# Patient Record
Sex: Female | Born: 1975 | Race: White | Hispanic: No | Marital: Married | Smoking: Former smoker
Health system: Southern US, Community
[De-identification: ages and names within clinical notes are randomized; demographics above are authoritative.]

## PROBLEM LIST (undated history)

## (undated) DIAGNOSIS — F411 Generalized anxiety disorder: Secondary | ICD-10-CM

## (undated) DIAGNOSIS — I10 Essential (primary) hypertension: Secondary | ICD-10-CM

## (undated) DIAGNOSIS — K219 Gastro-esophageal reflux disease without esophagitis: Secondary | ICD-10-CM

## (undated) DIAGNOSIS — C539 Malignant neoplasm of cervix uteri, unspecified: Secondary | ICD-10-CM

## (undated) HISTORY — PX: ROTATOR CUFF REPAIR: SHX139

## (undated) HISTORY — PX: BREAST BIOPSY: SHX20

## (undated) HISTORY — DX: Generalized anxiety disorder: F41.1

## (undated) HISTORY — DX: Essential (primary) hypertension: I10

## (undated) HISTORY — DX: Malignant neoplasm of cervix uteri, unspecified: C53.9

## (undated) HISTORY — DX: Gastro-esophageal reflux disease without esophagitis: K21.9

---

## 1997-07-10 HISTORY — PX: BACK SURGERY: SHX140

## 2006-07-10 DIAGNOSIS — C539 Malignant neoplasm of cervix uteri, unspecified: Secondary | ICD-10-CM

## 2006-07-10 HISTORY — PX: PARTIAL HYSTERECTOMY: SHX80

## 2006-07-10 HISTORY — DX: Malignant neoplasm of cervix uteri, unspecified: C53.9

## 2006-10-31 ENCOUNTER — Encounter (INDEPENDENT_AMBULATORY_CARE_PROVIDER_SITE_OTHER): Payer: Self-pay | Admitting: *Deleted

## 2006-10-31 ENCOUNTER — Ambulatory Visit: Payer: Self-pay | Admitting: Internal Medicine

## 2006-10-31 ENCOUNTER — Encounter: Payer: Self-pay | Admitting: Internal Medicine

## 2006-10-31 ENCOUNTER — Other Ambulatory Visit: Admission: RE | Admit: 2006-10-31 | Discharge: 2006-10-31 | Payer: Self-pay | Admitting: Internal Medicine

## 2006-10-31 LAB — CONVERTED CEMR LAB
Basophils Relative: 0.5 % (ref 0.0–1.0)
Eosinophils Relative: 4.3 % (ref 0.0–5.0)
HCT: 37.5 % (ref 36.0–46.0)
Hemoglobin: 13 g/dL (ref 12.0–15.0)
Monocytes Absolute: 0.5 10*3/uL (ref 0.2–0.7)
Neutrophils Relative %: 62.5 % (ref 43.0–77.0)
RBC: 3.97 M/uL (ref 3.87–5.11)
RDW: 12.8 % (ref 11.5–14.6)
TSH: 1.61 microintl units/mL (ref 0.35–5.50)
WBC: 7.9 10*3/uL (ref 4.5–10.5)

## 2006-11-20 ENCOUNTER — Ambulatory Visit (HOSPITAL_COMMUNITY): Admission: RE | Admit: 2006-11-20 | Discharge: 2006-11-20 | Payer: Self-pay | Admitting: Obstetrics and Gynecology

## 2006-11-20 ENCOUNTER — Encounter (INDEPENDENT_AMBULATORY_CARE_PROVIDER_SITE_OTHER): Payer: Self-pay | Admitting: Specialist

## 2006-12-12 ENCOUNTER — Encounter: Payer: Self-pay | Admitting: Internal Medicine

## 2006-12-15 ENCOUNTER — Encounter: Admission: RE | Admit: 2006-12-15 | Discharge: 2006-12-15 | Payer: Self-pay | Admitting: Surgery

## 2006-12-24 ENCOUNTER — Encounter: Payer: Self-pay | Admitting: Internal Medicine

## 2007-01-14 ENCOUNTER — Ambulatory Visit (HOSPITAL_COMMUNITY): Admission: RE | Admit: 2007-01-14 | Discharge: 2007-01-15 | Payer: Self-pay | Admitting: Obstetrics and Gynecology

## 2007-01-14 ENCOUNTER — Encounter (INDEPENDENT_AMBULATORY_CARE_PROVIDER_SITE_OTHER): Payer: Self-pay | Admitting: Obstetrics and Gynecology

## 2007-01-25 ENCOUNTER — Encounter: Payer: Self-pay | Admitting: Internal Medicine

## 2007-02-20 ENCOUNTER — Encounter: Payer: Self-pay | Admitting: Internal Medicine

## 2007-05-02 ENCOUNTER — Ambulatory Visit (HOSPITAL_COMMUNITY): Admission: RE | Admit: 2007-05-02 | Discharge: 2007-05-02 | Payer: Self-pay | Admitting: Obstetrics and Gynecology

## 2007-05-02 ENCOUNTER — Encounter (INDEPENDENT_AMBULATORY_CARE_PROVIDER_SITE_OTHER): Payer: Self-pay | Admitting: Obstetrics and Gynecology

## 2007-10-01 ENCOUNTER — Ambulatory Visit: Payer: Self-pay | Admitting: Internal Medicine

## 2007-10-01 DIAGNOSIS — J309 Allergic rhinitis, unspecified: Secondary | ICD-10-CM | POA: Insufficient documentation

## 2007-10-01 DIAGNOSIS — J45909 Unspecified asthma, uncomplicated: Secondary | ICD-10-CM

## 2007-11-06 ENCOUNTER — Ambulatory Visit: Payer: Self-pay | Admitting: Internal Medicine

## 2008-01-03 ENCOUNTER — Encounter: Admission: RE | Admit: 2008-01-03 | Discharge: 2008-01-03 | Payer: Self-pay | Admitting: Obstetrics and Gynecology

## 2008-04-27 ENCOUNTER — Ambulatory Visit: Payer: Self-pay | Admitting: Internal Medicine

## 2008-06-23 ENCOUNTER — Encounter: Admission: RE | Admit: 2008-06-23 | Discharge: 2008-06-23 | Payer: Self-pay | Admitting: Obstetrics and Gynecology

## 2008-07-01 ENCOUNTER — Ambulatory Visit: Payer: Self-pay | Admitting: Diagnostic Radiology

## 2008-07-01 ENCOUNTER — Emergency Department (HOSPITAL_BASED_OUTPATIENT_CLINIC_OR_DEPARTMENT_OTHER): Admission: EM | Admit: 2008-07-01 | Discharge: 2008-07-01 | Payer: Self-pay | Admitting: Emergency Medicine

## 2008-10-13 ENCOUNTER — Ambulatory Visit: Payer: Self-pay | Admitting: Internal Medicine

## 2008-10-13 ENCOUNTER — Encounter (INDEPENDENT_AMBULATORY_CARE_PROVIDER_SITE_OTHER): Payer: Self-pay | Admitting: *Deleted

## 2008-10-13 LAB — CONVERTED CEMR LAB
Heterophile Ab Screen: NEGATIVE
Rapid Strep: NEGATIVE

## 2008-10-19 ENCOUNTER — Telehealth (INDEPENDENT_AMBULATORY_CARE_PROVIDER_SITE_OTHER): Payer: Self-pay | Admitting: *Deleted

## 2009-01-06 ENCOUNTER — Encounter: Admission: RE | Admit: 2009-01-06 | Discharge: 2009-01-06 | Payer: Self-pay | Admitting: Obstetrics and Gynecology

## 2009-01-06 ENCOUNTER — Encounter (INDEPENDENT_AMBULATORY_CARE_PROVIDER_SITE_OTHER): Payer: Self-pay | Admitting: Diagnostic Radiology

## 2009-03-31 ENCOUNTER — Telehealth (INDEPENDENT_AMBULATORY_CARE_PROVIDER_SITE_OTHER): Payer: Self-pay | Admitting: *Deleted

## 2009-04-13 ENCOUNTER — Ambulatory Visit: Payer: Self-pay | Admitting: Internal Medicine

## 2009-04-13 LAB — CONVERTED CEMR LAB: Rapid Strep: NEGATIVE

## 2009-05-10 HISTORY — PX: MASS EXCISION: SHX2000

## 2009-05-18 ENCOUNTER — Encounter (INDEPENDENT_AMBULATORY_CARE_PROVIDER_SITE_OTHER): Payer: Self-pay | Admitting: Surgery

## 2009-05-18 ENCOUNTER — Ambulatory Visit (HOSPITAL_BASED_OUTPATIENT_CLINIC_OR_DEPARTMENT_OTHER): Admission: RE | Admit: 2009-05-18 | Discharge: 2009-05-18 | Payer: Self-pay | Admitting: Surgery

## 2009-05-26 ENCOUNTER — Telehealth (INDEPENDENT_AMBULATORY_CARE_PROVIDER_SITE_OTHER): Payer: Self-pay | Admitting: *Deleted

## 2009-06-11 ENCOUNTER — Encounter: Payer: Self-pay | Admitting: Internal Medicine

## 2010-03-11 ENCOUNTER — Ambulatory Visit: Payer: Self-pay | Admitting: Internal Medicine

## 2010-03-11 DIAGNOSIS — F411 Generalized anxiety disorder: Secondary | ICD-10-CM

## 2010-03-11 HISTORY — DX: Generalized anxiety disorder: F41.1

## 2010-04-04 ENCOUNTER — Encounter: Admission: RE | Admit: 2010-04-04 | Discharge: 2010-04-04 | Payer: Self-pay | Admitting: Internal Medicine

## 2010-04-04 ENCOUNTER — Ambulatory Visit: Payer: Self-pay | Admitting: Internal Medicine

## 2010-04-04 DIAGNOSIS — M25519 Pain in unspecified shoulder: Secondary | ICD-10-CM

## 2010-04-05 ENCOUNTER — Telehealth: Payer: Self-pay | Admitting: Internal Medicine

## 2010-04-05 ENCOUNTER — Encounter: Payer: Self-pay | Admitting: Internal Medicine

## 2010-08-09 NOTE — Letter (Signed)
Summary: RX shoulder surgery--- [facility]   Imported By: Lennie Odor 04/18/2010 14:19:10  _____________________________________________________________________  External Attachment:    Type:   Image     Comment:   External Document

## 2010-08-09 NOTE — Progress Notes (Signed)
Summary: MRI RESULTS??  Phone Note Call from Patient Call back at [facility] Phone [phone]   Caller: Patient Summary of Call: PATIENT HAD MRI YESTERDAY MORNING MONDAY 9/26----WAS TOLD THAT RESULTS WOULD BE BACK LAST NIGHT---WOULD LIKE TO KNOW RESULTS Initial call taken by: Jerolyn Shin,  April 05, 2010 11:17 AM  Follow-up for Phone Call        Explained to pt that Dr.Paz is currently out of the office and we have not gotten results from [facility] yet. Will call [facility] and see if I can get results. Army Fossa CMA  April 05, 2010 11:21 AM  no reports available yet Jose E. Paz MD  April 05, 2010 11:56 AM   Additional Follow-up for Phone Call Additional follow up Details #1::        Spoke to [place] Imaing and the report has not been finalized yet. Will send when done. Additional Follow-up by: Harold Barban,  April 05, 2010 1:05 PM    Additional Follow-up for Phone Call Additional follow up Details #2::    Discuss with patient result and referral put in awaiting appt info..................Marland KitchenFelecia Deloach CMA  April 05, 2010 1:46 PM

## 2010-08-09 NOTE — Assessment & Plan Note (Signed)
Summary: anxiety issue/kn   Vital Signs:  Patient profile:   35 year old female Height:      61 inches Weight:      147.50 pounds BMI:     27.97 Pulse rate:   69 / minute Pulse rhythm:   regular BP sitting:   126 / 80  (left arm) Cuff size:   regular  Vitals Entered By: Army Fossa CMA (March 11, 2010 9:08 AM) CC: Pt here to discuss anxiety.  Comments - Due to job stress.    History of Present Illness: loss of stress lately, yesterday she unexpectedly lost her job Very anxious, jittery.  Current Medications (verified): 1)  Singulair 10 Mg  Tabs (Montelukast Sodium) .Marland Kitchen.. 1 By Mouth Qd 2)  Ventolin Hfa 108 (90 Base) Mcg/act  Aers (Albuterol Sulfate) .... 2 Puffs Qid As Needed 3)  Pulmicort Flexhaler 90 Mcg/act  Inha (Budesonide) .Marland Kitchen.. 1 Puff Two Times A Day 4)  Claritin 10 Mg  Tabs (Loratadine) .... One Otc Pill Once Daily 5)  Flonase 50 Mcg/act  Susp (Fluticasone Propionate) .... 2 Sprays A Day On Each Side of The Nose 6)  Ibuprofen  Allergies: 1)  ! Pcn 2)  ! Sulfa  Past History:  Past Medical History: Reviewed history from 04/27/2008 and no changes required. G1 P1 Asthma NEOP, MALIGNANT, CERVIX  , s/p hysterectomy Allergic rhinitis  Past Surgical History: L4-L5 surgery (2000) Hysterectomy 11 -2010, excision of a soft mass from the right shoulder  Social History: Former Smoker Alcohol use-yes has  a partner (girlfrined) lives w/ her son   Review of Systems       denies depression so far  Physical Exam  General:  alert and well-developed.   Psych:  normally interactive, good eye contact, and not depressed appearing.  anxious.   Impression & Recommendations:  Problem # 1:  ANXIETY (ICD-300.00) acute anxiety, she just lost her job. Counseled Patient is staying positive and already networking to get another job Will try a low dose of alprazolam which she took before with good results No history of a alcohol or  other  substances abuse Her  updated medication list for this problem includes:    Alprazolam 0.25 Mg Tbdp (Alprazolam) .Marland Kitchen... 1 or 2 two times a day as needed for anxiety or insomnia  Problem # 2:  ASTHMA (ICD-493.90) refill her medications Her updated medication list for this problem includes:    Singulair 10 Mg Tabs (Montelukast sodium) .Marland Kitchen... 1 by mouth qd    Ventolin Hfa 108 (90 Base) Mcg/act Aers (Albuterol sulfate) .Marland Kitchen... 2 puffs qid as needed    Pulmicort Flexhaler 90 Mcg/act Inha (Budesonide) .Marland Kitchen... 1 puff two times a day  Problem # 3:  face-to-face more than 15 minutes, more than 50% of the time counseling  Complete Medication List: 1)  Singulair 10 Mg Tabs (Montelukast sodium) .Marland Kitchen.. 1 by mouth qd 2)  Ventolin Hfa 108 (90 Base) Mcg/act Aers (Albuterol sulfate) .... 2 puffs qid as needed 3)  Pulmicort Flexhaler 90 Mcg/act Inha (Budesonide) .Marland Kitchen.. 1 puff two times a day 4)  Claritin 10 Mg Tabs (Loratadine) .... One otc pill once daily 5)  Flonase 50 Mcg/act Susp (Fluticasone propionate) .... 2 sprays a day on each side of the nose 6)  Ibuprofen  7)  Alprazolam 0.25 Mg Tbdp (Alprazolam) .Marland Kitchen.. 1 or 2 two times a day as needed for anxiety or insomnia  Patient Instructions: 1)  call for a refill  2)  Please  schedule a follow-up appointment in 2 months.  Prescriptions: FLONASE 50 MCG/ACT  SUSP (FLUTICASONE PROPIONATE) 2 sprays a day on each side of the nose  #1 x 6   Entered and Authorized by:   Elita Quick E. Paz MD   Signed by:   Nolon Rod. Paz MD on 03/11/2010   Method used:   Print then Give to Patient   RxID:   [id number] PULMICORT FLEXHALER 90 MCG/ACT  INHA (BUDESONIDE) 1 puff two times a day  #1 x 6   Entered and Authorized by:   Nolon Rod. Paz MD   Signed by:   Nolon Rod. Paz MD on 03/11/2010   Method used:   Print then Give to Patient   RxID:   [id number] SINGULAIR 10 MG  TABS (MONTELUKAST SODIUM) 1 by mouth qd  #30 x 6   Entered and Authorized by:   Nolon Rod. Paz MD   Signed by:   Nolon Rod. Paz MD on  03/11/2010   Method used:   Print then Give to Patient   RxID:   [id number] VENTOLIN HFA 108 (90 BASE) MCG/ACT  AERS (ALBUTEROL SULFATE) 2 puffs qid as needed  #1 x 6   Entered and Authorized by:   Nolon Rod. Paz MD   Signed by:   Nolon Rod. Paz MD on 03/11/2010   Method used:   Print then Give to Patient   RxID:   [id number] ALPRAZOLAM 0.25 MG TBDP (ALPRAZOLAM) 1 or 2 two times a day as needed for anxiety or insomnia  #40 x 0   Entered and Authorized by:   Nolon Rod. Paz MD   Signed by:   Nolon Rod. Paz MD on 03/11/2010   Method used:   Print then Give to Patient   RxID:   [id number]

## 2010-08-09 NOTE — Assessment & Plan Note (Signed)
Summary: shoulder injury/cbs   Vital Signs:  Patient profile:   35 year old female Weight:      152.2 pounds Temp:     98.5 degrees F oral Pulse rate:   56 / minute Resp:     12 per minute BP sitting:   128 / 84  (left arm) Cuff size:   regular  Vitals Entered By: Shonna Chock CMA (April 04, 2010 9:02 AM) CC: Right shoulder pain x 1 year, worse x 1 month ( patient plays softball ofter-? injury), Shoulder pain   CC:  Right shoulder pain x 1 year, worse x 1 month ( patient plays softball ofter-? injury), and Shoulder pain.  History of Present Illness: Shoulder Pain      This is a 35 year old woman who presents with Shoulder pain present 1 year, worse x 1 month.  The patient reports numbness &  tingling from shoulder to elbow & weakness, locking, stiffness, and impaired ROM @ R shoulder.She denies swelling and redness.  The pain began acutely and with injury, after throwing a softball from the outfield in late Sept 2010.  The patient describes the pain as constant "aching"  as of 1 month ago with resuming playing softball. This affects even ability to  dress. The pain is better with ice and NSAIDS ( Aleve).  The pain is worse with activity.  PMH of fatty tumor removal R shoulder 05/2009.  Current Medications (verified): 1)  Singulair 10 Mg  Tabs (Montelukast Sodium) .Marland Kitchen.. 1 By Mouth Qd 2)  Ventolin Hfa 108 (90 Base) Mcg/act  Aers (Albuterol Sulfate) .... 2 Puffs Qid As Needed 3)  Pulmicort Flexhaler 90 Mcg/act  Inha (Budesonide) .Marland Kitchen.. 1 Puff Two Times A Day 4)  Claritin 10 Mg  Tabs (Loratadine) .... One Otc Pill Once Daily 5)  Flonase 50 Mcg/act  Susp (Fluticasone Propionate) .... 2 Sprays A Day On Each Side of The Nose 6)  Ibuprofen 7)  Alprazolam 0.25 Mg Tbdp (Alprazolam) .Marland Kitchen.. 1 or 2 Two Times A Day As Needed For Anxiety or Insomnia  Allergies: 1)  ! Pcn 2)  ! Sulfa  Past History:  Past Medical History: G1 P1 Asthma NEOP, MALIGNANT, CERVIX  , S/P  hysterectomy Allergic  rhinitis  Past Surgical History: L4-L5 surgery (2000) for Spondylolithesis with numbness of legs Hysterectomy 11 -2010, excision of a soft mass from the right shoulder  Review of Systems General:  Denies chills, fever, sweats, and weight loss. Derm:  Denies lesion(s) and rash. Neuro:  Denies brief paralysis.  Physical Exam  General:  well-nourished,in no acute distress; alert,appropriate and cooperative throughout examination Neck:  No deformities, masses, or tenderness noted. Full ROM Msk:  No deformity or scoliosis noted of thoracic or lumbar spine.   Pulses:  R and L radial pulses are full and equal bilaterally Extremities:  Pain with ROM & with opposition R shoulder.  Neurologic:  alert & oriented X3, sensation intact to light touch & and sensation intact to pinprick R upper arm Skin:  Intact without suspicious lesions or rashes. Op scar R shoulder Cervical Nodes:  No lymphadenopathy noted Axillary Nodes:  No palpable lymphadenopathy Psych:  memory intact for recent and remote, normally interactive, and good eye contact.     Shoulder/Elbow Exam  Inspection:    Inspection is normal.    Palpation:    Non-tender to palpation bilaterally.    Motor:    Normal strength in the upper extremities.    Reflexes:  Normal reflexes in the upper extremities.     Impression & Recommendations:  Problem # 1:  SHOULDER PAIN, RIGHT, CHRONIC (ICD-719.41)  Present 1 year post injury;acute exacerbation X 1 month  Orders: Radiology Referral (Radiology)  Her updated medication list for this problem includes:    Tramadol Hcl 50 Mg Tabs (Tramadol hcl) .Marland Kitchen... 1 every 6 hrs as needed for pain  Complete Medication List: 1)  Singulair 10 Mg Tabs (Montelukast sodium) .Marland Kitchen.. 1 by mouth qd 2)  Ventolin Hfa 108 (90 Base) Mcg/act Aers (Albuterol sulfate) .... 2 puffs qid as needed 3)  Pulmicort Flexhaler 90 Mcg/act Inha (Budesonide) .Marland Kitchen.. 1 puff two times a day 4)  Claritin 10 Mg Tabs  (Loratadine) .... One otc pill once daily 5)  Flonase 50 Mcg/act Susp (Fluticasone propionate) .... 2 sprays a day on each side of the nose 6)  Ibuprofen  7)  Alprazolam 0.25 Mg Tbdp (Alprazolam) .Marland Kitchen.. 1 or 2 two times a day as needed for anxiety or insomnia 8)  Tramadol Hcl 50 Mg Tabs (Tramadol hcl) .Marland Kitchen.. 1 every 6 hrs as needed for pain  Patient Instructions: 1)  No softball until pain resolves. Prescriptions: TRAMADOL HCL 50 MG TABS (TRAMADOL HCL) 1 every 6 hrs as needed for pain  #30 x 0   Entered and Authorized by:   Marga Melnick MD   Signed by:   Marga Melnick MD on 04/04/2010   Method used:   Print then Give to Patient   RxID:   [id number]

## 2010-10-12 LAB — POCT HEMOGLOBIN-HEMACUE: Hemoglobin: 15.2 g/dL — ABNORMAL HIGH (ref 12.0–15.0)

## 2010-11-22 NOTE — H&P (Signed)
NAMEALLA, SLOMA               ACCOUNT NO.:  [id number]   MEDICAL RECORD NO.:  [id number]          PATIENT TYPE:  AMB   LOCATION:  SDC                           FACILITY:  [facility]   PHYSICIAN:  Osborn Coho, M.D.   DATE OF BIRTH:  30-Aug-1975   DATE OF ADMISSION:  DATE OF DISCHARGE:                              HISTORY & PHYSICAL   HISTORY OF PRESENT ILLNESS:  Ms. Whitmore is a 35 year old single white  female para 1-0-0-1 who is status post hysterectomy, who presents for  laparoscopic left ovarian cystectomy because of a persistent left  ovarian cyst.  In a pelvic ultrasound in June 2008, prior to the  patient's total vaginal hysterectomy in July 2008, the patient was found  on ultrasound to have a simple-appearing left ovarian cyst measuring  4.25 x 3.31, without any increased Doppler blood flow.  A follow-up  ultrasound in September 2008 revealed a persistence of this left ovarian  cyst, which measured at that time 4.5 x 3.8 x 4.6 cm.  She denies any  pelvic pain, any fever, any urinary tract symptoms, vaginitis symptoms,  changes in her bowel habits or back pain.  Due to the persistent nature  of this ovarian cyst, the patient was given the option of observation or  removal, and she has consented to proceed with ovarian cystectomy.   PAST MEDICAL HISTORY:  OB History:  Gravida 1, para 1-0-0-1  GYN History:  Menarche 35 years old.  The patient has had a hysterectomy  (July 2008).  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  The  patient underwent a total vaginal hysterectomy in July 2008, because of  stage I, A1 cervical cancer.   MEDICAL HISTORY:  Stage I, A1 cervical cancer, asthma, seasonal  allergies.   SURGICAL HISTORY:  2000 spinal fusion of her lumbar spine, May 2008 cold  knife conization of the cervix, July 2008 total vaginal hysterectomy.   FAMILY HISTORY:  Cardiovascular disease, asthma, breast cancer in  (postmenopausal) hypertension, anemia, diabetes mellitus  and stroke.   HABITS:  The patient denies tobacco or illicit drug use.  She consumes a  case of beer per week.   SOCIAL HISTORY:  The patient is single, and she works for [employer],  and she lives with her partner.   CURRENT MEDICATIONS:  Singulair daily, albuterol inhaler as needed.   ALLERGIES:  PENICILLIN AND SULFA, BOTH FOR WHICH CAUSES HIVES AND  SWELLING.   REVIEW OF SYSTEMS:  The patient denies chest pain, shortness of breath,  headache, vision changes, and except as is mentioned in history of  present illness the patient's review of systems is negative.   PHYSICAL EXAM:  VITAL SIGNS:  Blood pressure 130/78.  Weight is 149.  Height is 5 feet, 1-1/2 inches tall.  GENERAL EXAM:  Neck is supple without masses.  HEART:  Regular rate and rhythm.  LUNGS:  Clear.  BACK:  No CVA tenderness.  ABDOMEN:  No tenderness, masses, organomegaly.  EXTREMITIES:  No clubbing, cyanosis or edema.  PELVIC:  EGBUS:  Within normal limits.  Vagina is  rugous.  Uterus and  cervix are surgically absent.  Adnexa without tenderness or masses.   IMPRESSION:  Persistent left ovarian cyst.   DISPOSITION:  A discussion was held with the patient regarding the  indications for her procedure, along with its risks which include but  are not limited to reaction to anesthesia, damage to adjacent organs,  infection, excessive bleeding and the possible need for an open  abdominal incision to complete her surgery safely.  The patient has  consented to proceed with a laparoscopic ovarian cystectomy, with a  possibility of laparotomy at [facility] on May 02, 2007.      [place] J. Adline Peals.      Osborn Coho, M.D.  Electronically Signed    EJP/MEDQ  D:  04/29/2007  T:  04/29/2007  Job:  [id number]

## 2010-11-22 NOTE — Op Note (Signed)
Caitlin Williamson, Caitlin Williamson               ACCOUNT NO.:  [id number]   MEDICAL RECORD NO.:  [id number]          PATIENT TYPE:  OIB   LOCATION:  9302                          [facility]ACILITY:  [facility]   PHYSICIAN:  Osborn Coho, M.D.   DATE OF BIRTH:  1976-05-21   DATE OF PROCEDURE:  01/14/2007  DATE OF DISCHARGE:  01/15/2007                               OPERATIVE REPORT   PREOPERATIVE DIAGNOSIS:  Stage IA1 cervical cancer.   POSTOPERATIVE DIAGNOSIS:  Stage IA1 cervical cancer.   PROCEDURE:  Total vaginal hysterectomy and cystoscopy.   ATTENDING:  Osborn Coho, M.D.   ASSISTANT:  Marquis Lunch. Adline Peals.   ANESTHESIA:  General.   SPECIMENS TO PATHOLOGY:  Uterus and cervix weighing less than 250 grams.   FLUIDS:  600 mL   URINE OUTPUT:  75 mL   ESTIMATED BLOOD LOSS:  50 mL   COMPLICATIONS:  None.   PROCEDURE:  The patient was taken to the operating room after risks,  benefits, alternative reviewed with the patient.  The patient verbalized  understanding and consent signed and witnessed.  De Blanch,  M.D. had been consulted after previous cervical conization was performed  with stage I A1 cervical cancer.  His recommendation was to proceed with  vaginal hysterectomy.  After the patient was placed under general  anesthesia in a dorsal lithotomy position a weighted speculum placed in  the patient's vagina and Deaver used for vaginal wall retraction.  The  cervix was injected with 20 mL of dilute Pitressin after the cervix was  circumscribed down to level of the pubocervical fascia with the Bovie.  The bladder was then dissected away from the cervix and the  vesicouterine peritoneum entered with the Metzenbaum scissors after  noting an area that was able to be tented and noted to be clear.  The  posterior cul-de-sac was then identified and entered with the Mayo  scissors.  The uterosacral ligaments were bilaterally clamped with a  curved Heaney clamp, cut and suture ligated using  0 Vicryl.  The long  weighted speculum was then placed in posterior cul-de-sac and  bilaterally and sequentially the paracervical tissue incorporating the  cardinal ligament was clamped with a curved Heaney clamp, cut and suture  ligated using 0 Vicryl.  The parametrial tissue in a similar bilateral  and sequential fashion was clamped with a curved Heaney, cut and suture  ligated using 0 Vicryl.  The Deaver had been placed in the anterior cul-  de-sac for better visualization and to protect the bladder as well.  The  parametrial tissue was clamped, cut and suture ligated up to the level  of the utero-ovarian pedicle.  The fundus was then exteriorized and the  remaining pedicles were clamped with two large Kelly's more on the side.  The tissue was then excised and tied with 0 Vicryl ties and suture  ligated as well with 0 Vicryl.  There was good hemostasis noted.  The  right ovary appeared to be within normal limits and the previous simple  cyst noted on ultrasound appeared to be on the left ovary.  The  angles  were then sutured using a free needle at the previously tied uterosacral  ligaments.  This was done bilaterally.  A McCall culdoplasty stitch was  then placed and upon noting that the utero-ovarian pedicles were  hemostatic.  The vaginal cuff was closed using 0 Vicryl via figure-of-  eight stitches.  The McCall culdoplasty stitch was then tied and there  was good hemostasis noted.  Cystoscopy was performed and indigo carmine  had been given.  The bilateral ureters were noted to efflux without  difficulty and the integrity of the bladder was intact.  Sponge, lap and  needle count was correct.  The patient tolerated procedure well and was  returned to the recovery room in good condition.      Osborn Coho, M.D.  Electronically Signed     AR/MEDQ  D:  01/16/2007  T:  01/16/2007  Job:  [id number]

## 2010-11-22 NOTE — Op Note (Signed)
NAMEARIONA, DESCHENE               ACCOUNT NO.:  [id number]   MEDICAL RECORD NO.:  [id number]          PATIENT TYPE:  AMB   LOCATION:  SDC                           [facility]   PHYSICIAN:  Osborn Coho, M.D.   DATE OF BIRTH:  1976/04/23   DATE OF PROCEDURE:  05/02/2007  DATE OF DISCHARGE:  05/02/2007                               OPERATIVE REPORT   PREOPERATIVE DIAGNOSIS:  Persistent left ovarian cyst.   POSTOPERATIVE DIAGNOSIS:  Persistent left ovarian cyst and pelvic  adhesions.   PROCEDURE:  1. Operative laparoscopy.  2. Left ovarian cystectomy.  3. Lysis of adhesions.   ATTENDING:  Osborn Coho, M.D.   ASSISTANT:  Marquis Lunch. Adline Peals.   ANESTHESIA:  General.   SPECIMENS TO PATHOLOGY:  Ovarian cysts.   FLUIDS:  1700 mL.   URINE OUTPUT:  100 mL.   ESTIMATED BLOOD LOSS:  Minimal.   COMPLICATIONS:  None.   PROCEDURE:  The patient was taken to the operating room after the risks,  benefits, and alternatives reviewed with the patient, and patient  verbalized understanding and consent signed and witnessed.  The patient  was placed under general per anesthesia and prepped and draped in the  normal sterile fashion.  A sponge stick was placed in the patient's  vagina, and attention was then turned to the abdomen where a 10-mm  vertical umbilical incision was made.  The Veress needle was introduced  into the intra-abdominal cavity and pneumoperitoneum achieved.  The 10-  mm trocar was then advanced to the intra-abdominal cavity and  laparoscope introduced.  The left ovarian cyst was noted, and the right  ovary and tube appeared to be within normal limits.  The patient had  been status post hysterectomy.  There were also adhesions of the bowel  to the underside of the left ovary and some adhesions to the ovary from  the anterior abdominal wall as well.  Attention was then turned to the  left lower quadrant where a 5-mm incision was made and 5-mm trocar  advanced  into the intra-abdominal cavity under direct visualization.  The same was done in the right lower quadrant.  Quarter percent Marcaine  had been injected at all incision sites prior to making the incisions.  Monopolar scissors and needle tip gyrus were used to make an incision  over the cyst.  Hydrodissection and dissection with the scissors was  then performed, and the cyst was shelled out.  Once the cyst was  removed, the laparoscope was changed over to a 5-mm scope which was  placed into the right lower quadrant.  An Endopouch was placed then  through the 10-mm umbilical incision and this placed into the pouch and  elevated through the incision and removed and sent to pathology.  There  was an additional cyst noted adjacent to this area on the ovary.  The  left 10-mm laparoscope was then changed over once again and placed into  the umbilical port, and the adjacent cyst was approximately 2-3 cm, and  this was excised and removed through the 10-mm umbilical incision.  A  similar fashion as previously.  The 10-mm laparoscope was then used once  again, and a less than 1-cm cyst was noted and appeared clear and was  ruptured and cauterized with the bipolar.  The remaining portion of the  ovary bed which was noted to be bleeding was cauterized with the bipolar  instrument, and good hemostasis was noted.  Gelfoam was placed at the  base of the ovary.  The intra-abdominal cavity was copiously irrigated,  and there was good hemostasis noted, even after relieving some of the  pneumoperitoneum.  The pneumoperitoneum was relieved, and the right and  left lower quadrant trocars were removed under direct visualization.  The 10-mm trocar was also removed under direct visualization.  The  fascia at the 10-mm incision was repaired with 0 Vicryl in a running  fashion, and the skin was reapproximated using 3-0 Monocryl via a  subcuticular stitch.  The right and left 5-mm incisions were repaired  with  Dermabond.  Foley was removed.  Sponge, lap and needle count was  correct.  The patient tolerated the procedure well and was returned to  the recovery room in good condition.      Osborn Coho, M.D.  Electronically Signed     AR/MEDQ  D:  05/03/2007  T:  05/04/2007  Job:  [id number]

## 2010-11-22 NOTE — Op Note (Signed)
Caitlin Williamson, Caitlin Williamson               ACCOUNT NO.:  [id number]   MEDICAL RECORD NO.:  [id number]          PATIENT TYPE:  AMB   LOCATION:  SDC                           FACILITY:  [facility]   PHYSICIAN:  Osborn Coho, M.D.   DATE OF BIRTH:  [birth year]   DATE OF PROCEDURE:  11/20/2006  DATE OF DISCHARGE:                               OPERATIVE REPORT   PREOPERATIVE DIAGNOSIS:  CIN-3 with foci suspicious for invasion.   POSTOPERATIVE DIAGNOSIS:  CIN-3 with foci suspicious for invasion.   PROCEDURE:  Cold-knife conization.   ATTENDING:  Dr. Osborn Coho.   ANESTHESIA:  General via LMA.   SPECIMENS TO PATHOLOGY:  Biopsy specimens.   FLUIDS:  1200 mL.   URINE OUTPUT:  Quantity sufficient via straight catheter prior to  procedure.   ESTIMATED BLOOD LOSS:  Minimal.   COMPLICATIONS:  None   PROCEDURE:  The patient was taken to the operating room after risks,  benefits and alternatives reviewed with the patient, and the patient  verbalized understanding and consent signed and witnessed.  The patient  was placed under general anesthesia and prepped and draped in the normal  sterile fashion.  A weighted speculum was placed in the patient's vagina  and a Deaver placed for anterior vaginal wall retraction as well as  lateral wall retraction.  A total of 20 mL of dilute Pitressin with 20  units of Pitressin and 100 mL of saline was injected into the patient's  cervix, and stitches of 0 Vicryl were placed at 3 o'clock and 9 o'clock.  Cold-knife conization was performed, paying particular attention to the  areas suspicious for invasion.  The primary cone specimen was tagged at  12 o'clock with 0 chromic and sent to pathology.  There were also deep  resections that were excised at 11 and 12 o'clock, and that was also  sent to pathology.  The bed of the cervix was cauterized with the Bovie.  Monsel's solution was placed.  There was good hemostasis.  Count was  correct.  The patient tolerated  the procedure well and was returned to  the recovery room in good condition.      Osborn Coho, M.D.  Electronically Signed    AR/MEDQ  D:  11/21/2006  T:  11/21/2006  Job:  [id number]

## 2010-11-22 NOTE — H&P (Signed)
NAME:  Caitlin Williamson, Caitlin Williamson NO.:  [id number]   MEDICAL RECORD NO.:  [id number]          PATIENT TYPE:  AMB   LOCATION:  SDC                           [facility]ILITY:  [facility]   PHYSICIAN:  Osborn Coho, M.D.   DATE OF BIRTH:  12-08-1975   DATE OF ADMISSION:  DATE OF DISCHARGE:                              HISTORY & PHYSICAL   HISTORY OF PRESENT ILLNESS:  Caitlin Williamson is a 35 year old single white  female, para 1-0-0-1, who presents for a total vaginal hysterectomy  because of stage IA-I cervical cancer.  The patient presented to her  primary care physician, Dr. Drue Novel, for routine physical examination and  Pap smear in April of this year.  The patient had had no previous  history of an abnormal Pap smear and her last Pap smear prior to that  visit had been three years prior.  It was observed on exam that her  cervix appeared abnormal and the results of her Pap smear showed high  grade squamous intraepithelial lesion.  The patient was referred to  [facility] and underwent a colposcopy  from which the biopsy results showed CIN-III with foci suspicious for  invasion.  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  A pelvic ultrasound in June  2008 showed a uterus measuring 8.25 cm by 5.16 cm by 4.1 cm and a simple  appearing left ovarian cyst measuring 4.25 cm with negative increased  blood flow.  A consultation was obtained with GYN oncologist, Dr. De Blanch, who then recommended that the patient undergo a  hysterectomy if her child bearing had been completed.  This  recommendation was shared with the patient and she consented to proceed  with a hysterectomy for management of her diagnosis.   OB HISTORY:  Gravida 1, para 1-0-0-1.  The patient had a spontaneous  vaginal birth at full term.   GYN HISTORY:  Menarche at 35  years old.  Her last menstrual period was  December 24, 2006.  She does not use any method of contraception.  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  Her last Pap smear is as per history of  present illness.   PAST MEDICAL HISTORY:  Asthma (no history of hospitalization or  intubation) and seasonal allergies.   PAST SURGICAL HISTORY:  In 2000, spinal fusion at L4-L5.  In 2008,  cervical cold knife conization.   FAMILY HISTORY:  Diabetes, hypertension, breast cancer (maternal  grandmother over age 29), stroke, and cardiovascular disease.   HABITS:  The patient does not use tobacco.  She consumes a case of beer  per week.   SOCIAL HISTORY:  The patient lives with her partner and the boys.   CURRENT MEDICATIONS:  Albuterol inhaler as needed (averaging 2-3 times  per week).   ALLERGIES:  PENICILLIN AND SULFA, both of which cause her to swell and  to break out in hives.   REVIEW OF SYSTEMS:  The patient denies chest pain, shortness  of breath,  dyspnea on exertion, abnormal vaginal bleeding, nausea, vomiting,  diarrhea, fever, headache, vision changes, myalgias, arthralgias and,  except as mentioned in the history of present illness, review of systems  is, otherwise, negative.   PHYSICAL EXAMINATION:  VITAL SIGNS:  Blood pressure 122/70, weight 146, height 5 feet 1 1/2  inches tall, temperature 97.2.  HEENT:  Pupils are equal, hearing is normal, throat is clear.  NECK:  Supple, there are no masses, cervical adenopathy, or thyromegaly.  HEART:  Regular rate and rhythm.  CHEST:  Clear.  BACK:  No CVA tenderness.  ABDOMEN:  No tenderness, masses, or organomegaly.  EXTREMITIES:  No cyanosis, clubbing, and edema.  PELVIC:  EG BUS is normal, vagina is normal, cervix nontender, no  lesions.  Uterus normal size, shape, and consistency, nontender.  Adnexa  no masses, no tenderness.   IMPRESSION:  Cervical cancer stage I-A-I.   DISPOSITION:  A discussion was held with the patient regarding  the  indications for her procedure along with its risks which include but are  not limited to reaction to anesthesia, damage to adjacent organs,  infection, and excessive bleeding.  The patient has consented to proceed  with a total vaginal hysterectomy at [facility] on  January 14, 2007, at 9:30 a.m.      [place] J. Adline Peals.      Osborn Coho, M.D.  Electronically Signed    EJP/MEDQ  D:  01/11/2007  T:  01/11/2007  Job:  [id number]

## 2011-04-14 LAB — COMPREHENSIVE METABOLIC PANEL
AST: 26 U/L (ref 0–37)
Albumin: 4.5 g/dL (ref 3.5–5.2)
Alkaline Phosphatase: 77 U/L (ref 39–117)
BUN: 13 mg/dL (ref 6–23)
CO2: 24 mEq/L (ref 19–32)
Chloride: 109 mEq/L (ref 96–112)
Potassium: 3.5 mEq/L (ref 3.5–5.1)
Total Bilirubin: 0.8 mg/dL (ref 0.3–1.2)

## 2011-04-14 LAB — URINALYSIS, ROUTINE W REFLEX MICROSCOPIC
Bilirubin Urine: NEGATIVE
Ketones, ur: 15 mg/dL — AB
Nitrite: NEGATIVE
Protein, ur: 30 mg/dL — AB
Urobilinogen, UA: 0.2 mg/dL (ref 0.0–1.0)
pH: 6 (ref 5.0–8.0)

## 2011-04-14 LAB — CBC
HCT: 41.3 % (ref 36.0–46.0)
Platelets: 346 10*3/uL (ref 150–400)
RBC: 4.37 MIL/uL (ref 3.87–5.11)
WBC: 9.3 10*3/uL (ref 4.0–10.5)

## 2011-04-14 LAB — URINE MICROSCOPIC-ADD ON

## 2011-04-14 LAB — DIFFERENTIAL
Basophils Absolute: 0 10*3/uL (ref 0.0–0.1)
Basophils Relative: 0 % (ref 0–1)
Eosinophils Relative: 0 % (ref 0–5)
Monocytes Absolute: 0.5 10*3/uL (ref 0.1–1.0)
Neutro Abs: 7.1 10*3/uL (ref 1.7–7.7)

## 2011-04-14 LAB — LIPASE, BLOOD: Lipase: 85 U/L (ref 23–300)

## 2011-04-19 LAB — HCG, SERUM, QUALITATIVE: Preg, Serum: NEGATIVE

## 2011-04-19 LAB — CBC
MCHC: 34.8
RBC: 4.43
RDW: 13.1

## 2011-04-25 LAB — CBC
HCT: 32.8 — ABNORMAL LOW
HCT: 32.8 — ABNORMAL LOW
HCT: 39.4
Hemoglobin: 11.3 — ABNORMAL LOW
Hemoglobin: 11.3 — ABNORMAL LOW
MCHC: 34.3
MCV: 93.2
Platelets: 272
RBC: 3.45 — ABNORMAL LOW
RBC: 4.25
RDW: 13.4
WBC: 10.5
WBC: 6.8

## 2011-04-25 LAB — BASIC METABOLIC PANEL
Chloride: 108
GFR calc non Af Amer: >60
Potassium: 4
Sodium: 139

## 2011-04-25 LAB — HCG, SERUM, QUALITATIVE: Preg, Serum: NEGATIVE

## 2011-09-07 ENCOUNTER — Encounter: Payer: Self-pay | Admitting: Obstetrics and Gynecology

## 2011-09-07 NOTE — Telephone Encounter (Signed)
This encounter was created in error - please disregard.

## 2011-10-02 ENCOUNTER — Encounter: Payer: Self-pay | Admitting: Internal Medicine

## 2011-10-02 ENCOUNTER — Ambulatory Visit (INDEPENDENT_AMBULATORY_CARE_PROVIDER_SITE_OTHER): Payer: Managed Care, Other (non HMO) | Admitting: Internal Medicine

## 2011-10-02 DIAGNOSIS — J45909 Unspecified asthma, uncomplicated: Secondary | ICD-10-CM

## 2011-10-02 DIAGNOSIS — Z0000 Encounter for general adult medical examination without abnormal findings: Secondary | ICD-10-CM | POA: Insufficient documentation

## 2011-10-02 MED ORDER — MONTELUKAST SODIUM 10 MG PO TABS: 10.0000 mg | ORAL_TABLET | Freq: Every day | ORAL | Status: DC

## 2011-10-02 MED ORDER — ALBUTEROL SULFATE HFA 108 (90 BASE) MCG/ACT IN AERS: 2.0000 | INHALATION_SPRAY | Freq: Four times a day (QID) | RESPIRATORY_TRACT | Status: DC | PRN

## 2011-10-02 MED ORDER — FLUTICASONE PROPIONATE 50 MCG/ACT NA SUSP: 2.0000 | Freq: Every day | NASAL | Status: DC

## 2011-10-02 MED ORDER — BECLOMETHASONE DIPROPIONATE 80 MCG/ACT IN AERS: 2.0000 | INHALATION_SPRAY | Freq: Two times a day (BID) | RESPIRATORY_TRACT | Status: DC

## 2011-10-02 NOTE — Assessment & Plan Note (Addendum)
Used to be on symbicort prn rec to use Qvair and ventolin prn D/c Qvair if asx x a while , restart every year before her allergy season

## 2011-10-02 NOTE — Progress Notes (Signed)
  Subjective:    Patient ID: Caitlin Williamson, female    DOB: 04/06/1976, 36 y.o.   MRN: [id number]  HPI CPX  Past Medical History: G1 P1 Asthma Cervical Ca---> s/p hysterectomy Allergic rhinitis  Past Surgical History: L4-L5 surgery (2000) Hysterectomy, no oophorectomy 2008 11 -2010, excision of a soft mass from the right shoulder Bx R breast (-)  Social History: has  a partner (girlfrined), household pt and son (26 y/o) Tobacco-- used to smoke lightly ETOH-- beer socially  Building control surveyor for [employer] Diet-- healthy most of the time  Exercise-- 2-3/week (gym)   Family History:    DM - M    HTN - M    Breast Ca - MGM    CAD - MGM    stroke - MGM    colon ca - no  Review of Systems No chest pain or shortness of breath No nausea, vomiting, diarrhea. No dysuria gross hematuria. No anxiety or depression. Asthma and allergies are well most of the time, this time of year she already started with itchy eyes and sneezing. She has used Symbicort before on  "prn" bases    Objective:   Physical Exam  General -- alert, well-developed, and well-nourished.   Neck --no thyromegaly , normal carotid pulse  Lungs -- normal respiratory effort, no intercostal retractions, no accessory muscle use, and normal breath sounds.   Heart-- normal rate, regular rhythm, no murmur, and no gallop.   Abdomen--soft, non-tender, no distention, no masses, no HSM, no guarding, and no rigidity.   Extremities-- no pretibial edema bilaterally Neurologic-- alert & oriented X3 and strength normal in all extremities. Psych-- Cognition and judgment appear intact. Alert and cooperative with normal attention span and concentration.  not anxious appearing and not depressed appearing.       Assessment & Plan:

## 2011-10-02 NOTE — Patient Instructions (Signed)
Please come back fasting: FLP, CMP, CBC, TSH--- dx V70 ------------------------------------ Use Qvar daily, Ventolin only as needed for wheezing or cough. If you're using Ventolin more than 4 times a day, let me know, we'll need to adjust your medications. In the times of the year you usuaslly don't have asthma you may attempt to discontinue Qvar

## 2011-10-02 NOTE — Assessment & Plan Note (Signed)
Td 08 Sees gyn Has MMGs Never had a cscope  Has a healthy life style, BMI 28, rec to stay in her current weight Labs

## 2011-10-03 ENCOUNTER — Other Ambulatory Visit (INDEPENDENT_AMBULATORY_CARE_PROVIDER_SITE_OTHER): Payer: Managed Care, Other (non HMO)

## 2011-10-03 DIAGNOSIS — D649 Anemia, unspecified: Secondary | ICD-10-CM

## 2011-10-03 DIAGNOSIS — Z0000 Encounter for general adult medical examination without abnormal findings: Secondary | ICD-10-CM

## 2011-10-03 LAB — CBC WITH DIFFERENTIAL/PLATELET
Basophils Absolute: 0 10*3/uL (ref 0.0–0.1)
HCT: 33.8 % — ABNORMAL LOW (ref 36.0–46.0)
Lymphs Abs: 1.8 10*3/uL (ref 0.7–4.0)
Monocytes Absolute: 0.3 10*3/uL (ref 0.1–1.0)
Monocytes Relative: 5.3 % (ref 3.0–12.0)
Platelets: 329 10*3/uL (ref 150.0–400.0)
RDW: 13.6 % (ref 11.5–14.6)

## 2011-10-03 LAB — COMPREHENSIVE METABOLIC PANEL
ALT: 15 U/L (ref 0–35)
CO2: 24 mEq/L (ref 19–32)
Chloride: 104 mEq/L (ref 96–112)
GFR: 78.5 mL/min (ref >60.00)
Potassium: 3.6 mEq/L (ref 3.5–5.1)
Sodium: 138 mEq/L (ref 135–145)
Total Bilirubin: 0.7 mg/dL (ref 0.3–1.2)
Total Protein: 6.9 g/dL (ref 6.0–8.3)

## 2011-10-03 LAB — TSH: TSH: 1.53 u[IU]/mL (ref 0.35–5.50)

## 2011-10-03 LAB — LIPID PANEL
HDL: 43.6 mg/dL (ref >39.00)
Total CHOL/HDL Ratio: 4

## 2011-10-12 ENCOUNTER — Encounter: Payer: Self-pay | Admitting: Gastroenterology

## 2011-10-12 ENCOUNTER — Telehealth: Payer: Self-pay | Admitting: Internal Medicine

## 2011-10-12 NOTE — Telephone Encounter (Signed)
Discussed with pt

## 2011-10-12 NOTE — Telephone Encounter (Signed)
Patient is calling, request more information about her diagnosis of Anemia, also has questions about her upcoming lab work, and what these test are for.

## 2011-10-18 ENCOUNTER — Other Ambulatory Visit (INDEPENDENT_AMBULATORY_CARE_PROVIDER_SITE_OTHER): Payer: Managed Care, Other (non HMO)

## 2011-10-18 DIAGNOSIS — D649 Anemia, unspecified: Secondary | ICD-10-CM

## 2011-10-18 LAB — CBC WITH DIFFERENTIAL/PLATELET
Basophils Absolute: 0 10*3/uL (ref 0.0–0.1)
Hemoglobin: 11.9 g/dL — ABNORMAL LOW (ref 12.0–15.0)
Lymphocytes Relative: 24.8 % (ref 12.0–46.0)
Monocytes Relative: 6.5 % (ref 3.0–12.0)
Neutrophils Relative %: 66.2 % (ref 43.0–77.0)
Platelets: 287 10*3/uL (ref 150.0–400.0)
RDW: 13.5 % (ref 11.5–14.6)

## 2011-10-18 LAB — FOLATE: Folate: 18.1 ng/mL (ref >5.9)

## 2011-10-18 LAB — FERRITIN: Ferritin: 7.5 ng/mL — ABNORMAL LOW (ref 10.0–291.0)

## 2011-11-01 ENCOUNTER — Telehealth: Payer: Self-pay | Admitting: Gastroenterology

## 2011-11-01 ENCOUNTER — Ambulatory Visit: Payer: Managed Care, Other (non HMO) | Admitting: Gastroenterology

## 2011-11-01 NOTE — Telephone Encounter (Signed)
Do not charge  

## 2011-11-14 ENCOUNTER — Other Ambulatory Visit: Payer: Managed Care, Other (non HMO)

## 2011-11-14 ENCOUNTER — Encounter: Payer: Self-pay | Admitting: Gastroenterology

## 2011-11-14 ENCOUNTER — Ambulatory Visit (INDEPENDENT_AMBULATORY_CARE_PROVIDER_SITE_OTHER): Payer: Managed Care, Other (non HMO) | Admitting: Gastroenterology

## 2011-11-14 VITALS — BP 122/70 | HR 60 | Ht 61.0 in | Wt 148.5 lb

## 2011-11-14 DIAGNOSIS — D649 Anemia, unspecified: Secondary | ICD-10-CM

## 2011-11-14 NOTE — Patient Instructions (Signed)
You will have labs checked today in the basement lab.  Please head down after you check out with the front desk  (celiac sprue). Will decide on upper/lower endoscopy after the labs are back.

## 2011-11-14 NOTE — Progress Notes (Signed)
HPI: This is a  very pleasant 36 year old woman whom I am meeting for the first time today.  She was referred for mild anemia  No overt bleeding.  NO gi bleeding.  Has lost 12 pounds in past 6 months, unintentionally.  Has regular BMs twice a day.  No signficant pains in abd.  She does eat meat, usually chicken.  Takes NSAID about twice a week at most.    Had cervical cancer, s/p TAH 2008, done here in [place].     Blood work in the past 1-2 months shows a hemoglobin of 11.9, MCV 94, platelets normal, ferritin 7.5. Complete metabolic panel was normal.   Review of systems: Pertinent positive and negative review of systems were noted in the above HPI section. Complete review of systems was performed and was otherwise normal.    Past Medical History  Diagnosis Date  . Asthma   . Cervical cancer 2008  . Allergic rhinitis   . Other abnormal heart sounds     heart arrhythmia    Past Surgical History  Procedure Date  . Partial hysterectomy 2008  . Back surgery 1999  . Mass excision     shoulder  right  . Breast biopsy     right   . Rotator cuff repair     right    Current Outpatient Prescriptions  Medication Sig Dispense Refill  . albuterol (VENTOLIN HFA) 108 (90 BASE) MCG/ACT inhaler Inhale 2 puffs into the lungs every 6 (six) hours as needed for wheezing.  1 Inhaler  1  . beclomethasone (QVAR) 80 MCG/ACT inhaler Inhale 2 puffs into the lungs 2 (two) times daily.  1 Inhaler  12  . fluticasone (FLONASE) 50 MCG/ACT nasal spray Place 2 sprays into the nose daily.  16 g  12  . montelukast (SINGULAIR) 10 MG tablet Take 1 tablet (10 mg total) by mouth at bedtime.  30 tablet  12    Allergies as of 11/14/2011 - Review Complete 11/14/2011  Allergen Reaction Noted  . Penicillins  10/01/2007  . Sulfonamide derivatives  10/01/2007    Family History  Problem Relation Age of Onset  . Diabetes Mother   . Hypertension Mother   . Breast cancer Maternal Grandmother   . Stroke Maternal  Grandmother   . Colon cancer Neg Hx   . Heart disease Maternal Grandmother     History   Social History  . Marital Status: Single    Spouse Name: N/A    Number of Children: 1  . 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     Social History Main Topics  . Smoking status: Former Games developer  . Smokeless tobacco: Never Used  . Alcohol Use: Yes     beer social   . Drug Use: No  . Sexually Active: Not on file   Other Topics Concern  . Not on file   Social History Narrative  . No narrative on file       Physical Exam: BP 122/70  Pulse 60  Ht 5\' 1"  (1.549 m)  Wt 148 lb 8 oz (67.359 kg)  BMI 28.06 kg/m2 Constitutional: generally well-appearing Psychiatric: alert and oriented x3 Eyes: extraocular movements intact Mouth: oral pharynx moist, no lesions Neck: supple no lymphadenopathy Cardiovascular: heart regular rate and rhythm Lungs: clear to auscultation bilaterally Abdomen: soft, nontender, nondistended, no obvious ascites, no peritoneal signs, normal bowel sounds Extremities: no lower extremity edema bilaterally Skin: no lesions on  visible extremities    Assessment and plan: 36 y.o. female with  mild anemia, ferritin low  She has no overt bleeding. She had a hysterectomy and so does not have periods anymore. She has had no overt GI bleeding. Her bowels are fairly normal but only to first check her for celiac sprue I blood work. If this is negative then we would proceed with colonoscopy to check for occult lesions. If her celiac group panel is positive then we would proceed with EGD to confirm it.

## 2011-11-15 LAB — CELIAC PANEL 10: IgA: 241 mg/dL (ref 69–380)

## 2012-01-07 ENCOUNTER — Encounter (HOSPITAL_BASED_OUTPATIENT_CLINIC_OR_DEPARTMENT_OTHER): Payer: Self-pay | Admitting: *Deleted

## 2012-01-07 ENCOUNTER — Emergency Department (HOSPITAL_BASED_OUTPATIENT_CLINIC_OR_DEPARTMENT_OTHER)
Admission: EM | Admit: 2012-01-07 | Discharge: 2012-01-07 | Disposition: A | Discharge status: Home / Self Care | Payer: Managed Care, Other (non HMO) | Attending: Emergency Medicine | Admitting: Emergency Medicine

## 2012-01-07 DIAGNOSIS — W540XXA Bitten by dog, initial encounter: Secondary | ICD-10-CM | POA: Insufficient documentation

## 2012-01-07 DIAGNOSIS — Z23 Encounter for immunization: Secondary | ICD-10-CM | POA: Insufficient documentation

## 2012-01-07 DIAGNOSIS — S51809A Unspecified open wound of unspecified forearm, initial encounter: Secondary | ICD-10-CM | POA: Insufficient documentation

## 2012-01-07 MED ORDER — RABIES IMMUNE GLOBULIN 150 UNIT/ML IM INJ
20.0000 [IU]/kg | INJECTION | Freq: Once | INTRAMUSCULAR | Status: AC
  Administered 2012-01-07: 1350 [IU]

## 2012-01-07 MED ORDER — LIDOCAINE-EPINEPHRINE 2 %-1:100000 IJ SOLN
INTRAMUSCULAR | Status: AC
  Filled 2012-01-07: qty 1

## 2012-01-07 MED ORDER — OXYCODONE-ACETAMINOPHEN 5-325 MG PO TABS
2.0000 | ORAL_TABLET | Freq: Once | ORAL | Status: AC
  Administered 2012-01-07: 2 via ORAL

## 2012-01-07 MED ORDER — OXYCODONE-ACETAMINOPHEN 5-325 MG PO TABS: 1.0000 | ORAL_TABLET | ORAL | Status: AC | PRN

## 2012-01-07 MED ORDER — RABIES IMMUNE GLOBULIN 150 UNIT/ML IM INJ
INJECTION | INTRAMUSCULAR | Status: AC
  Filled 2012-01-07: qty 10

## 2012-01-07 MED ORDER — LIDOCAINE HCL 2 % IJ SOLN: 10.0000 mL | Freq: Once | INTRAMUSCULAR | Status: DC

## 2012-01-07 MED ORDER — RABIES VACCINE, PCEC IM SUSR
1.0000 mL | Freq: Once | INTRAMUSCULAR | Status: AC
Start: 2012-01-07 — End: 2012-01-07
  Administered 2012-01-07: 1 mL via INTRAMUSCULAR
  Filled 2012-01-07: qty 1

## 2012-01-07 MED ORDER — OXYCODONE-ACETAMINOPHEN 5-325 MG PO TABS
ORAL_TABLET | ORAL | Status: AC
  Administered 2012-01-07: 2 via ORAL
  Filled 2012-01-07: qty 2

## 2012-01-07 MED ORDER — TETANUS-DIPHTH-ACELL PERTUSSIS 5-2.5-18.5 LF-MCG/0.5 IM SUSP
0.5000 mL | Freq: Once | INTRAMUSCULAR | Status: AC
  Administered 2012-01-07: 0.5 mL via INTRAMUSCULAR
  Filled 2012-01-07: qty 0.5

## 2012-01-07 NOTE — ED Notes (Signed)
I wrapped wounds with kerlix over 4x4's thickly padded and then applied quickfit wrist- velcro splint per Dr. Rosalia Hammers request.

## 2012-01-07 NOTE — ED Notes (Signed)
Pt presents to ED today with dog bite while walking dog.  Dog is unknown to patient and ran away after bite.  Animal control not notified.

## 2012-01-07 NOTE — Discharge Instructions (Signed)
Animal Bite Animal bite wounds can get infected. It is important to get proper medical treatment. Ask your doctor if you need a rabies shot. HOME CARE   Follow your doctor's instructions for taking care of your wound.   Only take medicine as told by your doctor.   Take your medicine (antibiotics) as told. Finish them even if you start to feel better.   Keep all doctor visits as told.  You may need a tetanus shot if:   You cannot remember when you had your last tetanus shot.   You have never had a tetanus shot.   The injury broke your skin.  If you need a tetanus shot and you choose not to have one, you may get tetanus. Sickness from tetanus can be serious. GET HELP RIGHT AWAY IF:   Your wound is warm, red, sore, or puffy (swollen).   You notice yellowish-white fluid (pus) or a bad smell coming from the wound.   You see a red line on the skin coming from the wound.   You have a fever, chills, or you feel sick.   You feel sick to your stomach (nauseous), or you throw up (vomit).   Your pain does not go away, or it gets worse.   You have trouble moving the injured part.   You have questions or concerns.  MAKE SURE YOU:   Understand these instructions.   Will watch your condition.   Will get help right away if you are not doing well or get worse.  Document Released: 06/26/2005 Document Revised: 06/15/2011 Document Reviewed: 02/15/2011 [facility] Patient Information 2012 [place], [place].  Sutures out 8 days

## 2012-01-07 NOTE — ED Provider Notes (Signed)
History  This chart was scribed for Caitlin Quarry, MD by Ladona Ridgel Day. This patient was seen in room MH02/MH02 and the patient's care was started at 2146.  CSN: [id number]  Arrival date & time 01/07/12  2135   First MD Initiated Contact with Patient 01/07/12 2146      Chief Complaint  Patient presents with  . Animal Bite     Patient is a 36 y.o. female presenting with animal bite. The history is provided by the patient. No language interpreter was used.  Animal Bite  The incident occurred just prior to arrival. The incident occurred in the street. She came to the ER via personal transport. There is an injury to the left forearm. The pain is mild. It is unlikely that a foreign body is present. Pertinent negatives include no nausea and no vomiting. Her tetanus status is unknown. She has received no recent medical care.    Caitlin Williamson is a 36 y.o. female who presents to the Emergency Department complaining of a dog bite that occurred while she was walking her dog approximately 30 minutes PTA. She states that the dog was unknown to her and ran off after the incident. She denies calling animal control. She has puncture wounds and scratches on her left forearm. She reports flushing the wounds with tap water for about 5 minutes after the incident. The bleeding is controlled currently. She denies having any other injuries and denies fever, nausea and emesis as associated symptoms. She is unsure of when her last TD vaccine was. She has a h/o asthma and cervical CA. She is a former smoker but denies alcohol use.   Past Medical History  Diagnosis Date  . Asthma   . Cervical cancer 2008  . Allergic rhinitis   . Other abnormal heart sounds     heart arrhythmia    Past Surgical History  Procedure Date  . Partial hysterectomy 2008  . Back surgery 1999  . Mass excision     shoulder  right  . Breast biopsy     right   . Rotator cuff repair     right    Family History  Problem Relation Age  of Onset  . Diabetes Mother   . Hypertension Mother   . Breast cancer Maternal Grandmother   . Stroke Maternal Grandmother   . Colon cancer Neg Hx   . Heart disease Maternal Grandmother     History  Substance Use Topics  . Smoking status: Former Games developer  . Smokeless tobacco: Never Used  . Alcohol Use: Yes     beer social    No OB history included  Review of Systems  Constitutional: Negative for fever.  Gastrointestinal: Negative for nausea and vomiting.  Skin: Positive for wound.    Allergies  Penicillins and Sulfonamide derivatives  Home Medications   Current Outpatient Rx  Name Route Sig Dispense Refill  . ALBUTEROL SULFATE HFA 108 (90 BASE) MCG/ACT IN AERS Inhalation Inhale 2 puffs into the lungs every 6 (six) hours as needed for wheezing. 1 Inhaler 1  . BECLOMETHASONE DIPROPIONATE 80 MCG/ACT IN AERS Inhalation Inhale 2 puffs into the lungs 2 (two) times daily. 1 Inhaler 12  . FLUTICASONE PROPIONATE 50 MCG/ACT NA SUSP Nasal Place 2 sprays into the nose daily. 16 g 12  . IBUPROFEN 200 MG PO TABS Oral Take 400-600 mg by mouth every 6 (six) hours as needed. For pain    . MONTELUKAST SODIUM 10 MG PO TABS Oral Take  1 tablet (10 mg total) by mouth at bedtime. 30 tablet 12    Triage Vitals: BP 126/70  Pulse 92  Temp 98.9 F (37.2 C) (Oral)  Resp 20  SpO2 100%  Physical Exam  Nursing note and vitals reviewed. Constitutional: She is oriented to person, place, and time. She appears well-developed and well-nourished. No distress.  HENT:  Head: Normocephalic and atraumatic.  Eyes: EOM are normal.  Neck: Neck supple. No tracheal deviation present.  Cardiovascular: Normal rate.   Pulmonary/Chest: Effort normal. No respiratory distress.  Musculoskeletal: Normal range of motion.       Full active ROM of left wrist w/mild tenderness w/wrist flexion. She had 2 puncture wounds and 4 scratches c/w a dog bite.   Neurological: She is alert and oriented to person, place, and time.    Skin: Skin is warm and dry.  Psychiatric: She has a normal mood and affect. Her behavior is normal.    ED Course  LACERATION REPAIR Date/Time: 01/07/2012 10:36 PM Performed by: Caitlin Williamson Authorized by: Caitlin Williamson Consent: Verbal consent obtained. Risks and benefits: risks, benefits and alternatives were discussed Consent given by: patient Patient understanding: patient states understanding of the procedure being performed Patient identity confirmed: verbally with patient Time out: Immediately prior to procedure a "time out" was called to verify the correct patient, procedure, equipment, support staff and site/side marked as required. Body area: upper extremity Location details: left lower arm Laceration length: 4 cm Foreign bodies: no foreign bodies Tendon involvement: none Nerve involvement: none Vascular damage: no Anesthesia: local infiltration Local anesthetic: lidocaine 2% with epinephrine Anesthetic total: 1 ml Patient sedated: no Preparation: Patient was prepped and draped in the usual sterile fashion. Irrigation solution: saline Irrigation method: syringe Amount of cleaning: extensive Debridement: minimal Degree of undermining: none Skin closure: 4-0 nylon Number of sutures: 1 Technique: simple Approximation: loose Approximation difficulty: simple Dressing: 4x4 sterile gauze Patient tolerance: Patient tolerated the procedure well with no immediate complications. Comments: Wound 1 on dorsal aspect of forearm- see diagram   LACERATION REPAIR Date/Time: 01/07/2012 10:38 PM Performed by: Caitlin Williamson Authorized by: Caitlin Williamson Consent: Verbal consent not obtained. Risks and benefits: risks, benefits and alternatives were discussed Consent given by: patient Patient understanding: patient states understanding of the procedure being performed Patient identity confirmed: verbally with patient Time out: Immediately prior to procedure a "time out"  was called to verify the correct patient, procedure, equipment, support staff and site/side marked as required. Body area: upper extremity Laceration length: 2 cm Foreign bodies: no foreign bodies Tendon involvement: none Nerve involvement: none Vascular damage: no Anesthesia: local infiltration Local anesthetic: lidocaine 1% with epinephrine Anesthetic total: 1 ml Patient sedated: no Preparation: Patient was prepped and draped in the usual sterile fashion. Irrigation solution: saline Irrigation method: syringe Amount of cleaning: extensive Debridement: none Degree of undermining: none Skin closure: 4-0 Prolene Number of sutures: 1 Technique: simple Approximation: loose Approximation difficulty: simple Dressing: 4x4 sterile gauze Patient tolerance: Patient tolerated the procedure well with no immediate complications.   (including critical care time)   DIAGNOSTIC STUDIES: Oxygen Saturation is 100% on room air, normal by my interpretation.    COORDINATION OF CARE: 2150-Discussed treatment plan with patient, which includes rabies and TD vaccine and patient agreed.    Labs Reviewed - No data to display No results found.   No diagnosis found.    Patient advised regarding bite wounds. She is advised to have close observation for any  signs or symptoms of infection. She is to recheck with her primary care Dr. She is receiving rabies in often and prophylaxis here. She's also receiving T.dap. She received 2 Percocet by mouth. She is having a dressing and a cockup gutter splint of that wrist placed. She is advised to have sutures out in 8-10 days. I personally performed the services described in this documentation, which was scribed in my presence. The recorded information has been reviewed and considered.       Caitlin Quarry, MD 01/07/12 2240

## 2012-01-10 ENCOUNTER — Encounter (HOSPITAL_COMMUNITY): Payer: Self-pay | Admitting: *Deleted

## 2012-01-10 ENCOUNTER — Emergency Department (INDEPENDENT_AMBULATORY_CARE_PROVIDER_SITE_OTHER)
Admission: EM | Admit: 2012-01-10 | Discharge: 2012-01-10 | Disposition: A | Discharge status: Home / Self Care | Payer: Managed Care, Other (non HMO) | Source: Home / Self Care

## 2012-01-10 DIAGNOSIS — Z23 Encounter for immunization: Secondary | ICD-10-CM

## 2012-01-10 MED ORDER — RABIES VACCINE, PCEC IM SUSR
INTRAMUSCULAR | Status: AC
  Filled 2012-01-10: qty 1

## 2012-01-10 MED ORDER — RABIES VACCINE, PCEC IM SUSR
1.0000 mL | Freq: Once | INTRAMUSCULAR | Status: AC
  Administered 2012-01-10: 1 mL via INTRAMUSCULAR

## 2012-01-10 NOTE — ED Notes (Signed)
Pt  Is  Here   For   Day   3  Rabies   Vaccine   She  Reports   No    Symptoms

## 2012-01-14 ENCOUNTER — Emergency Department (HOSPITAL_COMMUNITY)
Admission: EM | Admit: 2012-01-14 | Discharge: 2012-01-14 | Disposition: A | Discharge status: Home / Self Care | Payer: Managed Care, Other (non HMO) | Source: Home / Self Care | Attending: Emergency Medicine | Admitting: Emergency Medicine

## 2012-01-14 ENCOUNTER — Encounter (HOSPITAL_COMMUNITY): Payer: Self-pay | Admitting: *Deleted

## 2012-01-14 DIAGNOSIS — W540XXA Bitten by dog, initial encounter: Secondary | ICD-10-CM

## 2012-01-14 DIAGNOSIS — Z23 Encounter for immunization: Secondary | ICD-10-CM

## 2012-01-14 DIAGNOSIS — T148XXA Other injury of unspecified body region, initial encounter: Secondary | ICD-10-CM

## 2012-01-14 MED ORDER — CLINDAMYCIN HCL 300 MG PO CAPS: 300.0000 mg | ORAL_CAPSULE | Freq: Four times a day (QID) | ORAL | Status: DC

## 2012-01-14 MED ORDER — RABIES VACCINE, PCEC IM SUSR
1.0000 mL | Freq: Once | INTRAMUSCULAR | Status: AC
  Administered 2012-01-14: 1 mL via INTRAMUSCULAR

## 2012-01-14 MED ORDER — RABIES VACCINE, PCEC IM SUSR
INTRAMUSCULAR | Status: AC
  Filled 2012-01-14: qty 1

## 2012-01-14 MED ORDER — CLINDAMYCIN HCL 300 MG PO CAPS: 300.0000 mg | ORAL_CAPSULE | Freq: Four times a day (QID) | ORAL | Status: AC

## 2012-01-14 NOTE — ED Notes (Addendum)
Presents for rabies injection and removal of sutures to left forearm/wrist site x 2.  Wrist site slightly swollen and draining sero-sang drainage.

## 2012-01-14 NOTE — ED Provider Notes (Signed)
Chief Complaint  Patient presents with  . Rabies Injection  . Suture / Staple Removal    History of Present Illness:  The patient is a 36 year old female who was bitten by a dog on 01/07/2012. She was out walking her dog when an unknown, straight dog bit her on the left arm. She flushed it out with water for 5 minutes then went to the emergency room. She received a tetanus vaccine there and 2 stitches were placed. She was started on a rabies series and is in today to get her third rabies vaccine. She is due to get her stitches out today. The laceration the dorsal surface of the forearm appears to be a little bit infected with some swelling, erythema, and purulent drainage. She denies any fever or chills. She has full range of motion of all joints.  Review of Systems:  Other than noted above, the patient denies any of the following symptoms: Systemic:  No fever or chills. Musculoskeletal:  No joint pain or decreased range of motion. Neuro:  No numbness, tingling, or weakness.  PMFSH:  Past medical history, family history, social history, meds, and allergies were reviewed.  Physical Exam:   Vital signs:  BP 140/98  Pulse 69  Temp 98.7 F (37.1 C) (Oral)  Resp 17  SpO2 100% Ext:  She has 2 puncture wounds which have been sutured one on the dorsal and volar aspect of the forearm. The one on the volar aspect appears to be well-healed without any evidence of infection. The laceration on the dorsal aspect has some swelling, some redness, and some purulent drainage.  All joints had a full ROM without pain.  Pulses were full.  Good capillary refill in all digits.  No edema. Neurological:  Alert and oriented.  No muscle weakness.  Sensation was intact to light touch.   Procedure: Verbal informed consent was obtained.  The patient was informed of the risks and benefits of the procedure and understands and accepts.  Identity of the patient was verified verbally and by wristband. The sutures were  removed, antibiotic ointment was applied, and sterile dressings were applied. She was instructed in wound care and will return again in a week for her fourth and final rabies vaccine, or earlier if there is signs that the infection is progressing.  Medications given in [facility]:  She was given her third rabies vaccine.  Assessment:  The encounter diagnosis was Dog bite.  Plan:   1.  The following meds were prescribed:   New Prescriptions   CLINDAMYCIN (CLEOCIN) 300 MG CAPSULE    Take 1 capsule (300 mg total) by mouth 4 (four) times daily.   2.  The patient was instructed in wound care and pain control, and handouts were given. 3.  The patient was told to return in 7 days for suture removal or wound recheck or sooner if any sign of infection.    Reuben Likes, MD 01/14/12 [phone]

## 2012-01-14 NOTE — ED Notes (Signed)
Suture removal performed per Dr. Lorenz Coaster.

## 2012-01-22 ENCOUNTER — Telehealth (HOSPITAL_COMMUNITY): Payer: Self-pay | Admitting: *Deleted

## 2012-01-22 ENCOUNTER — Emergency Department (INDEPENDENT_AMBULATORY_CARE_PROVIDER_SITE_OTHER)
Admission: EM | Admit: 2012-01-22 | Discharge: 2012-01-22 | Disposition: A | Discharge status: Home / Self Care | Payer: Managed Care, Other (non HMO) | Source: Home / Self Care

## 2012-01-22 ENCOUNTER — Encounter (HOSPITAL_COMMUNITY): Payer: Self-pay | Admitting: *Deleted

## 2012-01-22 DIAGNOSIS — Z23 Encounter for immunization: Secondary | ICD-10-CM

## 2012-01-22 DIAGNOSIS — L0390 Cellulitis, unspecified: Secondary | ICD-10-CM

## 2012-01-22 MED ORDER — RABIES VACCINE, PCEC IM SUSR
INTRAMUSCULAR | Status: AC
  Filled 2012-01-22: qty 1

## 2012-01-22 MED ORDER — RABIES VACCINE, PCEC IM SUSR
1.0000 mL | Freq: Once | INTRAMUSCULAR | Status: AC
  Administered 2012-01-22: 1 mL via INTRAMUSCULAR

## 2012-01-22 NOTE — ED Notes (Signed)
Here  For   Wound  Check        And  The  Last  Of her  Rabies  Shots            The  Wound  Appears  To  Be  Well   Healing

## 2012-01-22 NOTE — ED Provider Notes (Signed)
Medical screening examination/treatment/procedure(s) were performed by a resident physician and as supervising physician I was immediately available for consultation/collaboration.  Leslee Home, M.D.   Reuben Likes, MD 01/22/12 [id number]

## 2012-01-22 NOTE — ED Provider Notes (Signed)
Caitlin Williamson is a 36 y.o. female who presents to Urgent Care today for rabies vaccine and skin check. She was seen last week by Dr. Lorenz Coaster for rabies vaccine followup. At that point the wound appeared to be infected and she was started on clindamycin. In the interim she has finished her clindamycin course and feels well. She denies any skin redness fevers or chills. She applies a small amount of antibiotic ointment intermittently.   PMH reviewed. Elevated blood pressure History  Substance Use Topics  . Smoking status: Former Games developer  . Smokeless tobacco: Never Used  . Alcohol Use: Yes     beer social    ROS as above Medications reviewed. Current Facility-Administered Medications  Medication Dose Route Frequency Provider Last Rate Last Dose  . rabies vaccine, PCEC (RABAVERT) injection 1 mL  1 mL Intramuscular Once Rodolph Bong, MD       Current Outpatient Prescriptions  Medication Sig Dispense Refill  . albuterol (VENTOLIN HFA) 108 (90 BASE) MCG/ACT inhaler Inhale 2 puffs into the lungs every 6 (six) hours as needed for wheezing.  1 Inhaler  1  . beclomethasone (QVAR) 80 MCG/ACT inhaler Inhale 2 puffs into the lungs 2 (two) times daily.  1 Inhaler  12  . clindamycin (CLEOCIN) 300 MG capsule Take 1 capsule (300 mg total) by mouth 4 (four) times daily.  40 capsule  0  . fluticasone (FLONASE) 50 MCG/ACT nasal spray Place 2 sprays into the nose daily.  16 g  12  . ibuprofen (ADVIL,MOTRIN) 200 MG tablet Take 400-600 mg by mouth every 6 (six) hours as needed. For pain      . montelukast (SINGULAIR) 10 MG tablet Take 1 tablet (10 mg total) by mouth at bedtime.  30 tablet  12    Exam:  BP 156/100  Pulse 64  Temp 98.4 F (36.9 C) (Oral)  Resp 16  SpO2 100% Gen: Well NAD SKIN: Well healing wound without any erythema or exudate. Nontender  No results found for this or any previous visit (from the past 24 hour(s)). No results found.  Assessment and Plan: 36 y.o. female with resolving  cellulitis following a dog bite. Patient was started on clindamycin for which he has finished and is doing well.  Additionally we'll administer the last rabies vaccine. Patient should followup with her primary care doctor as needed.     Rodolph Bong, MD 01/22/12 [phone]

## 2012-01-22 NOTE — ED Notes (Signed)
I called pt. and asked if she got her last rabies vaccine somewhere else. She said no. She was still out of town yesterday. States she is on her way here now and will get her in 30 min. I told her I would be gone but one of the other nurses could give it to her. She might have to wait a little. She voiced understanding. Vassie Moselle 01/22/2012

## 2012-01-29 ENCOUNTER — Ambulatory Visit (INDEPENDENT_AMBULATORY_CARE_PROVIDER_SITE_OTHER): Payer: Managed Care, Other (non HMO) | Admitting: Internal Medicine

## 2012-01-29 ENCOUNTER — Encounter: Payer: Self-pay | Admitting: Internal Medicine

## 2012-01-29 VITALS — BP 152/90 | HR 55 | Temp 98.1°F | Wt 152.0 lb

## 2012-01-29 DIAGNOSIS — R030 Elevated blood-pressure reading, without diagnosis of hypertension: Secondary | ICD-10-CM

## 2012-01-29 DIAGNOSIS — I10 Essential (primary) hypertension: Secondary | ICD-10-CM | POA: Insufficient documentation

## 2012-01-29 NOTE — Patient Instructions (Addendum)
Arterial Hypertension Arterial hypertension (high blood pressure) is a condition of elevated pressure in your blood vessels. Hypertension over a long period of time is a risk factor for strokes, heart attacks, and heart failure. It is also the leading cause of kidney (renal) failure.  CAUSES   In Adults -- Over 90% of all hypertension has no known cause. This is called essential or primary hypertension. In the other 10% of people with hypertension, the increase in blood pressure is caused by another disorder. This is called secondary hypertension. Important causes of secondary hypertension are:   Heavy alcohol use.   Obstructive sleep apnea.   Hyperaldosterosim (Conn's syndrome).   Steroid use.   Chronic kidney failure.   Hyperparathyroidism.   Medications.   Renal artery stenosis.   Pheochromocytoma.   Cushing's disease.   Coarctation of the aorta.   Scleroderma renal crisis.   Licorice (in excessive amounts).   Drugs (cocaine, methamphetamine).  Your caregiver can explain any items above that apply to you.  In Children -- Secondary hypertension is more common and should always be considered.   Pregnancy -- Few women of childbearing age have high blood pressure. However, up to 10% of them develop hypertension of pregnancy. Generally, this will not harm the woman. It Even be a sign of 3 complications of pregnancy: preeclampsia, HELLP syndrome, and eclampsia. Follow up and control with medication is necessary.  SYMPTOMS   This condition normally does not produce any noticeable symptoms. It is usually found during a routine exam.   Malignant hypertension is a late problem of high blood pressure. It Monger have the following symptoms:   Headaches.   Blurred vision.   End-organ damage (this means your kidneys, heart, lungs, and other organs are being damaged).   Stressful situations can increase the blood pressure. If a person with normal blood pressure has their blood  pressure go up while being seen by their caregiver, this is often termed "white coat hypertension." Its importance is not known. It Alkire be related with eventually developing hypertension or complications of hypertension.   Hypertension is often confused with mental tension, stress, and anxiety.  DIAGNOSIS  The diagnosis is made by 3 separate blood pressure measurements. They are taken at least 1 week apart from each other. If there is organ damage from hypertension, the diagnosis Lemire be made without repeat measurements. Hypertension is usually identified by having blood pressure readings:  Above 140/90 mmHg measured in both arms, at 3 separate times, over a couple weeks.   Over 130/80 mmHg should be considered a risk factor and Grisanti require treatment in patients with diabetes.  Blood pressure readings over 120/80 mmHg are called "pre-hypertension" even in non-diabetic patients. To get a true blood pressure measurement, use the following guidelines. Be aware of the factors that can alter blood pressure readings.  Take measurements at least 1 hour after caffeine.   Take measurements 30 minutes after smoking and without any stress. This is another reason to quit smoking - it raises your blood pressure.   Use a proper cuff size. Ask your caregiver if you are not sure about your cuff size.   Most home blood pressure cuffs are automatic. They will measure systolic and diastolic pressures. The systolic pressure is the pressure reading at the start of sounds. Diastolic pressure is the pressure at which the sounds disappear. If you are elderly, measure pressures in multiple postures. Try sitting, lying or standing.   Sit at rest for a minimum of   5 minutes before taking measurements.   You should not be on any medications like decongestants. These are found in many cold medications.   Record your blood pressure readings and review them with your caregiver.  If you have hypertension:  Your caregiver  may do tests to be sure you do not have secondary hypertension (see "causes" above).   Your caregiver may also look for signs of metabolic syndrome. This is also called Syndrome X or Insulin Resistance Syndrome. You may have this syndrome if you have type 2 diabetes, abdominal obesity, and abnormal blood lipids in addition to hypertension.   Your caregiver will take your medical and family history and perform a physical exam.   Diagnostic tests may include blood tests (for glucose, cholesterol, potassium, and kidney function), a urinalysis, or an EKG. Other tests may also be necessary depending on your condition.  PREVENTION  There are important lifestyle issues that you can adopt to reduce your chance of developing hypertension:  Maintain a normal weight.   Limit the amount of salt (sodium) in your diet.   Exercise often.   Limit alcohol intake.   Get enough potassium in your diet. Discuss specific advice with your caregiver.   Follow a DASH diet (dietary approaches to stop hypertension). This diet is rich in fruits, vegetables, and low-fat dairy products, and avoids certain fats.  PROGNOSIS  Essential hypertension cannot be cured. Lifestyle changes and medical treatment can lower blood pressure and reduce complications. The prognosis of secondary hypertension depends on the underlying cause. Many people whose hypertension is controlled with medicine or lifestyle changes can live a normal, healthy life.  RISKS AND COMPLICATIONS  While high blood pressure alone is not an illness, it often requires treatment due to its short- and long-term effects on many organs. Hypertension increases your risk for:  CVAs or strokes (cerebrovascular accident).   Heart failure due to chronically high blood pressure (hypertensive cardiomyopathy).   Heart attack (myocardial infarction).   Damage to the retina (hypertensive retinopathy).   Kidney failure (hypertensive nephropathy).  Your caregiver can  explain list items above that apply to you. Treatment of hypertension can significantly reduce the risk of complications. TREATMENT   For overweight patients, weight loss and regular exercise are recommended. Physical fitness lowers blood pressure.   Mild hypertension is usually treated with diet and exercise. A diet rich in fruits and vegetables, fat-free dairy products, and foods low in fat and salt (sodium) can help lower blood pressure. Decreasing salt intake decreases blood pressure in a 1/3 of people.   Stop smoking if you are a smoker.  The steps above are highly effective in reducing blood pressure. While these actions are easy to suggest, they are difficult to achieve. Most patients with moderate or severe hypertension end up requiring medications to bring their blood pressure down to a normal level. There are several classes of medications for treatment. Blood pressure pills (antihypertensives) will lower blood pressure by their different actions. Lowering the blood pressure by 10 mmHg may decrease the risk of complications by as much as 25%. The goal of treatment is effective blood pressure control. This will reduce your risk for complications. Your caregiver will help you determine the best treatment for you according to your lifestyle. What is excellent treatment for one person, may not be for you. HOME CARE INSTRUCTIONS   Do not smoke.   Follow the lifestyle changes outlined in the "Prevention" section.   If you are on medications, follow the directions   carefully. Blood pressure medications must be taken as prescribed. Skipping doses reduces their benefit. It also puts you at risk for problems.   Follow up with your caregiver, as directed.   If you are asked to monitor your blood pressure at home, follow the guidelines in the "Diagnosis" section above.  SEEK MEDICAL CARE IF:   You think you are having medication side effects.   You have recurrent headaches or lightheadedness.     You have swelling in your ankles.   You have trouble with your vision.  SEEK IMMEDIATE MEDICAL CARE IF:   You have sudden onset of chest pain or pressure, difficulty breathing, or other symptoms of a heart attack.   You have a severe headache.   You have symptoms of a stroke (such as sudden weakness, difficulty speaking, difficulty walking).  MAKE SURE YOU:   Understand these instructions.   Will watch your condition.   Will get help right away if you are not doing well or get worse.  Document Released: 06/26/2005 Document Revised: 06/15/2011 Document Reviewed: 01/24/2007 [facility] Patient Information 2012 [place], [place].  Check the  blood pressure 2 or 3 times a week, be sure it is between 110/60 and 140/85. If it is consistently higher or lower, let me know

## 2012-01-29 NOTE — Assessment & Plan Note (Addendum)
slightly elevated BP for the last few weeks. The only thing she is doing different is that she is not as active as before and has eaten  in restaurants more often. Plan: Keep monitoring BP A low-salt diet and physical activity was discussed with the patient. see instructions, will call if her BP continued to be elevated.

## 2012-01-29 NOTE — Progress Notes (Signed)
  Subjective:    Patient ID: Caitlin Williamson, female    DOB: 1976-01-29, 36 y.o.   MRN: [id number]  HPI Acute visit For the last few weeks her BP has been consistently elevated. It has been checked because she has been getting rabies shots after a dog bite. 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 No lower extremity edema Admits that she has been less physically active and her diet has not been as good as before (due to her job, she eats out a lot) Takes ibuprofen at most twice a week. Mild increase in stress but nothing serious    Objective:   Physical Exam General -- alert, well-developed. No apparent distress.  Neck --normal carotid pulse Lungs -- normal respiratory effort, no intercostal retractions, no accessory muscle use, and normal breath sounds.   Heart-- normal rate, regular rhythm, no murmur, and no gallop.   Abdomen--soft, non-tender, no distention, no bruit Extremities-- no pretibial edema bilaterally ; symmetric radial pulses Neurologic-- alert & oriented X3 and strength normal in all extremities. Psych-- Cognition and judgment appear intact. Alert and cooperative with normal attention span and concentration.  not anxious appearing and not depressed appearing.      Assessment & Plan:

## 2012-02-05 ENCOUNTER — Encounter: Payer: Self-pay | Admitting: Gastroenterology

## 2012-02-05 ENCOUNTER — Other Ambulatory Visit: Payer: Self-pay | Admitting: Obstetrics and Gynecology

## 2012-02-05 DIAGNOSIS — N63 Unspecified lump in unspecified breast: Secondary | ICD-10-CM

## 2012-02-12 ENCOUNTER — Ambulatory Visit
Admission: RE | Admit: 2012-02-12 | Discharge: 2012-02-12 | Disposition: A | Discharge status: Home / Self Care | Payer: Managed Care, Other (non HMO) | Source: Ambulatory Visit | Attending: Obstetrics and Gynecology | Admitting: Obstetrics and Gynecology

## 2012-02-12 DIAGNOSIS — N63 Unspecified lump in unspecified breast: Secondary | ICD-10-CM

## 2012-02-16 ENCOUNTER — Ambulatory Visit (AMBULATORY_SURGERY_CENTER): Payer: Managed Care, Other (non HMO) | Admitting: *Deleted

## 2012-02-16 VITALS — Ht 61.0 in | Wt 147.0 lb

## 2012-02-16 DIAGNOSIS — Z1211 Encounter for screening for malignant neoplasm of colon: Secondary | ICD-10-CM

## 2012-02-16 DIAGNOSIS — D649 Anemia, unspecified: Secondary | ICD-10-CM

## 2012-02-16 MED ORDER — MOVIPREP 100 G PO SOLR: ORAL | Status: DC

## 2012-03-04 ENCOUNTER — Encounter: Payer: Self-pay | Admitting: Gastroenterology

## 2012-03-04 ENCOUNTER — Ambulatory Visit (AMBULATORY_SURGERY_CENTER): Payer: Managed Care, Other (non HMO) | Admitting: Gastroenterology

## 2012-03-04 VITALS — BP 145/85 | HR 56 | Temp 98.2°F | Resp 16 | Ht 61.0 in | Wt 147.0 lb

## 2012-03-04 DIAGNOSIS — Z1211 Encounter for screening for malignant neoplasm of colon: Secondary | ICD-10-CM

## 2012-03-04 DIAGNOSIS — D649 Anemia, unspecified: Secondary | ICD-10-CM

## 2012-03-04 MED ORDER — SODIUM CHLORIDE 0.9 % IV SOLN: 500.0000 mL | INTRAVENOUS | Status: DC

## 2012-03-04 NOTE — Op Note (Signed)
[facility] [address] [place], [address]   COLONOSCOPY PROCEDURE REPORT  PATIENT: Caitlin Williamson, Caitlin Williamson  MR#: [id number] BIRTHDATE: 10/02/1975 , 36  yrs. old GENDER: Female ENDOSCOPIST: Rachael Fee, MD REFERRED XB:JYNW Drue Novel, M.D. PROCEDURE DATE:  03/04/2012 PROCEDURE:   Colonoscopy, diagnostic ASA CLASS:   Class II INDICATIONS:iron deficiency anemia. MEDICATIONS: Fentanyl 100 mcg IV, Versed 8 mg IV, and These medications were titrated to patient response per physician's verbal order  DESCRIPTION OF PROCEDURE:   After the risks benefits and alternatives of the procedure were thoroughly explained, informed consent was obtained.  A digital rectal exam revealed no abnormalities of the rectum.   The LB [id number]-[id number]  endoscope was introduced through the anus and advanced to the cecum, which was identified by both the appendix and ileocecal valve. No adverse events experienced.   The quality of the prep was excellent.  The instrument was then slowly withdrawn as the colon was fully examined.   COLON FINDINGS: A normal appearing cecum, ileocecal valve, and appendiceal orifice were identified.  The ascending, hepatic flexure, transverse, splenic flexure, descending, sigmoid colon and rectum appeared unremarkable.  No polyps or cancers were seen. Retroflexed views revealed no abnormalities. The time to cecum=2 minutes 0 seconds  Withdrawal time=6 minutes 0 seconds.  The scope was withdrawn and the procedure completed. COMPLICATIONS: There were no complications.  ENDOSCOPIC IMPRESSION: Normal colon No explanation for your anemia  RECOMMENDATIONS: Please complete 2 sets of fecal occult blood home tests and return to GI.  If any are positive for occult blood, then you will be set up for an upper endoscopy.   eSigned:  Rachael Fee, MD 03/04/2012 1:46 PM

## 2012-03-04 NOTE — Patient Instructions (Signed)
YOU HAD AN ENDOSCOPIC PROCEDURE TODAY AT THE [facility]: Refer to the procedure report that was given to you for any specific questions about what was found during the examination.  If the procedure report does not answer your questions, please call your gastroenterologist to clarify.  If you requested that your care partner not be given the details of your procedure findings, then the procedure report has been included in a sealed envelope for you to review at your convenience later.  YOU SHOULD EXPECT: Some feelings of bloating in the abdomen. Passage of more gas than usual.  Walking can help get rid of the air that was put into your GI tract during the procedure and reduce the bloating. If you had a lower endoscopy (such as a colonoscopy or flexible sigmoidoscopy) you may notice spotting of blood in your stool or on the toilet paper. If you underwent a bowel prep for your procedure, then you may not have a normal bowel movement for a few days.  DIET: Your first meal following the procedure should be a light meal and then it is ok to progress to your normal diet.  A half-sandwich or bowl of soup is an example of a good first meal.  Heavy or fried foods are harder to digest and may make you feel nauseous or bloated.  Likewise meals heavy in dairy and vegetables can cause extra gas to form and this can also increase the bloating.  Drink plenty of fluids but you should avoid alcoholic beverages for 24 hours.  ACTIVITY: Your care partner should take you home directly after the procedure.  You should plan to take it easy, moving slowly for the rest of the day.  You can resume normal activity the day after the procedure however you should NOT DRIVE or use heavy machinery for 24 hours (because of the sedation medicines used during the test).    SYMPTOMS TO REPORT IMMEDIATELY: A gastroenterologist can be reached at any hour.  During normal business hours, 8:30 AM to 5:00 PM Monday through Friday,  call [phone].  After hours and on weekends, please call the GI answering service at [phone] who will take a message and have the physician on call contact you.   Following lower endoscopy (colonoscopy or flexible sigmoidoscopy):  Excessive amounts of blood in the stool  Significant tenderness or worsening of abdominal pains  Swelling of the abdomen that is new, acute  Fever of 100F or higher    FOLLOW UP: If any biopsies were taken you will be contacted by phone or by letter within the next 1-3 weeks.  Call your gastroenterologist if you have not heard about the biopsies in 3 weeks.  Our staff will call the home number listed on your records the next business day following your procedure to check on you and address any questions or concerns that you may have at that time regarding the information given to you following your procedure. This is a courtesy call and so if there is no answer at the home number and we have not heard from you through the emergency physician on call, we will assume that you have returned to your regular daily activities without incident.  SIGNATURES/CONFIDENTIALITY: You and/or your care partner have signed paperwork which will be entered into your electronic medical record.  These signatures attest to the fact that that the information above on your After Visit Summary has been reviewed and is understood.  Full responsibility of the confidentiality   of this discharge information lies with you and/or your care-partner.    CARDS TO CHECK STOOL FOR OCCULT BLOOD SENT HOME WITH YOU TO DO AS DIRECTED & RETURN TO [facility]

## 2012-03-04 NOTE — Progress Notes (Signed)
Patient did not experience any of the following events: a burn prior to discharge; a fall within the facility; wrong site/side/patient/procedure/implant event; or a hospital transfer or hospital admission upon discharge from the facility. (G8907) Patient did not have preoperative order for IV antibiotic SSI prophylaxis. (G8918)  

## 2012-03-05 ENCOUNTER — Telehealth: Payer: Self-pay | Admitting: *Deleted

## 2012-03-05 ENCOUNTER — Other Ambulatory Visit: Payer: Self-pay

## 2012-03-05 DIAGNOSIS — D649 Anemia, unspecified: Secondary | ICD-10-CM

## 2012-03-05 NOTE — Progress Notes (Signed)
Pt stool cards mailed to the home

## 2012-03-05 NOTE — Telephone Encounter (Signed)
  Follow up Call-  Call back number 03/04/2012  Post procedure Call Back phone  # [phone]  Permission to leave phone message Yes     Patient questions:  Do you have a fever, pain , or abdominal swelling? no Pain Score  0 *  Have you tolerated food without any problems? yes  Have you been able to return to your normal activities? yes  Do you have any questions about your discharge instructions: Diet   no Medications  no Follow up visit  no  Do you have questions or concerns about your Care? no  Actions: * If pain score is 4 or above: No action needed, pain <4.

## 2012-04-01 ENCOUNTER — Other Ambulatory Visit: Payer: Managed Care, Other (non HMO)

## 2012-04-01 DIAGNOSIS — D649 Anemia, unspecified: Secondary | ICD-10-CM

## 2012-04-02 LAB — HEMOCCULT SLIDES (X 3 CARDS)
Fecal Occult Blood: NEGATIVE
OCCULT 2: NEGATIVE
OCCULT 3: NEGATIVE
OCCULT 4: NEGATIVE
OCCULT 5: NEGATIVE

## 2012-06-03 ENCOUNTER — Ambulatory Visit (INDEPENDENT_AMBULATORY_CARE_PROVIDER_SITE_OTHER): Payer: Managed Care, Other (non HMO) | Admitting: Internal Medicine

## 2012-06-03 ENCOUNTER — Encounter: Payer: Self-pay | Admitting: Internal Medicine

## 2012-06-03 VITALS — BP 140/84 | HR 58 | Temp 98.1°F | Wt 153.0 lb

## 2012-06-03 DIAGNOSIS — R030 Elevated blood-pressure reading, without diagnosis of hypertension: Secondary | ICD-10-CM

## 2012-06-03 DIAGNOSIS — D509 Iron deficiency anemia, unspecified: Secondary | ICD-10-CM

## 2012-06-03 DIAGNOSIS — Z23 Encounter for immunization: Secondary | ICD-10-CM

## 2012-06-03 DIAGNOSIS — F411 Generalized anxiety disorder: Secondary | ICD-10-CM

## 2012-06-03 MED ORDER — ALBUTEROL SULFATE HFA 108 (90 BASE) MCG/ACT IN AERS: 2.0000 | INHALATION_SPRAY | Freq: Four times a day (QID) | RESPIRATORY_TRACT | Status: DC | PRN

## 2012-06-03 MED ORDER — ALPRAZOLAM 0.5 MG PO TABS: 0.5000 mg | ORAL_TABLET | Freq: Two times a day (BID) | ORAL | Status: DC | PRN

## 2012-06-03 MED ORDER — FLUTICASONE PROPIONATE 50 MCG/ACT NA SUSP: 2.0000 | Freq: Every day | NASAL | Status: DC

## 2012-06-03 MED ORDER — MONTELUKAST SODIUM 10 MG PO TABS: 10.0000 mg | ORAL_TABLET | Freq: Every day | ORAL | Status: DC

## 2012-06-03 NOTE — Assessment & Plan Note (Addendum)
Have some issues with her son, counseled . We talk about medication, something for everyday use versus PRN meds. She thinks PRN is ok. In the past she tried Xanax successfully. Plan: prn Xanax

## 2012-06-03 NOTE — Assessment & Plan Note (Addendum)
BP has been elevated twice daily, BP here today normal. Has not been following a healthier diet or exercising Plan:  Continue monitoring BPs, see  instructions

## 2012-06-03 NOTE — Progress Notes (Signed)
  Subjective:    Patient ID: Caitlin Williamson, female    DOB: 27-Nov-1975, 36 y.o.   MRN: [id number]  HPI Routine office visit, Elevated BP, has checked her BPs sporadically, amylase was elevated at work around 170/98.  History of anemia, chart reviewed, see assessment and plan. Anxiety, having a lot of problems lately, mostly related to her son who has not finished high school. Occasional difficult time sleeping.  Past Medical History: G1 P1 Asthma Cervical Ca---> s/p hysterectomy Allergic rhinitis  Past Surgical History: L4-L5 surgery (2000) Hysterectomy, no oophorectomy 2008 11 -2010, excision of a soft mass from the right shoulder Bx R breast (-)  Social History: has  a partner (girlfrined), household pt and son (46 y/o) Tobacco-- used to smoke lightly ETOH-- beer socially   Building control surveyor for [employer]   Review of Systems Had some dizziness once when her BP was elevated, and that happened last week but since then she is asymptomatic. Denies chest pain or shortness of breath Denies any nausea, vomiting, diarrhea or blood in the stools. Last night has some pain in their right leg between the hip and knee, no back pain, no rash, pain is gone this afternoon. No depression    Objective:   Physical Exam General -- alert, well-developed, and overweight appearing. No apparent distress.   Lungs -- normal respiratory effort, no intercostal retractions, no accessory muscle use, and normal breath sounds.   Heart-- normal rate, regular rhythm, no murmur, and no gallop.   Abdomen--soft, non-tender, no distention, no masses, no HSM, no guarding, and no rigidity.   Extremities-- no pretibial edema bilaterally  Psych-- Cognition and judgment appear intact. Alert and cooperative with normal attention span and concentration.  slt emotional during the visit ,not depression.       Assessment & Plan:

## 2012-06-03 NOTE — Assessment & Plan Note (Signed)
Noted to have mild anemia few months ago, since then , sprue screening was negative, colonoscopy 02-2012 was negative, Hemoccult negative. GI suggested no further eval. She has not been taking iron supplements. She is status post a hysterectomy Plan: Labs

## 2012-06-03 NOTE — Patient Instructions (Addendum)
Low-salt diet, exercise 30 minutes daily Keep an eye on your  blood pressure 2 or 3 times a week, be sure it is between 110/60 and 140/85. If it is consistently higher or lower, let me know Come back in 3 months, sooner if your BPs elevated.

## 2012-06-04 LAB — CBC WITH DIFFERENTIAL/PLATELET
Basophils Absolute: 0.1 10*3/uL (ref 0.0–0.1)
Eosinophils Absolute: 0.1 10*3/uL (ref 0.0–0.7)
Lymphs Abs: 1.9 10*3/uL (ref 0.7–4.0)
MCHC: 33.2 g/dL (ref 30.0–36.0)
MCV: 94.8 fl (ref 78.0–100.0)
Monocytes Absolute: 0.3 10*3/uL (ref 0.1–1.0)
Neutrophils Relative %: 66.2 % (ref 43.0–77.0)
Platelets: 301 10*3/uL (ref 150.0–400.0)
RDW: 13.3 % (ref 11.5–14.6)
WBC: 7.2 10*3/uL (ref 4.5–10.5)

## 2012-07-04 ENCOUNTER — Other Ambulatory Visit: Payer: Self-pay | Admitting: Internal Medicine

## 2012-07-05 ENCOUNTER — Encounter: Payer: Self-pay | Admitting: *Deleted

## 2012-07-05 NOTE — Telephone Encounter (Signed)
Ok to refill? Last OV 11.25.13 Last filled 11.25.13.

## 2012-07-05 NOTE — Telephone Encounter (Signed)
done

## 2012-07-30 ENCOUNTER — Encounter: Payer: Self-pay | Admitting: Internal Medicine

## 2012-12-26 ENCOUNTER — Ambulatory Visit (INDEPENDENT_AMBULATORY_CARE_PROVIDER_SITE_OTHER): Payer: Managed Care, Other (non HMO) | Admitting: Internal Medicine

## 2012-12-26 VITALS — BP 142/88 | HR 55 | Wt 151.0 lb

## 2012-12-26 DIAGNOSIS — I10 Essential (primary) hypertension: Secondary | ICD-10-CM

## 2012-12-26 DIAGNOSIS — F411 Generalized anxiety disorder: Secondary | ICD-10-CM

## 2012-12-26 DIAGNOSIS — J45909 Unspecified asthma, uncomplicated: Secondary | ICD-10-CM

## 2012-12-26 DIAGNOSIS — D509 Iron deficiency anemia, unspecified: Secondary | ICD-10-CM

## 2012-12-26 MED ORDER — LOSARTAN POTASSIUM 50 MG PO TABS: 50.0000 mg | ORAL_TABLET | Freq: Every day | ORAL | Status: DC

## 2012-12-26 NOTE — Assessment & Plan Note (Signed)
See history of present illness, mild hypertension. Plan:  Start losartan, see instructions Discussed low-salt diet

## 2012-12-26 NOTE — Assessment & Plan Note (Signed)
Her son is moving to [facility], some stress but symptoms well-controlled with Xanax as needed. Denies need of further medication

## 2012-12-26 NOTE — Progress Notes (Signed)
  Subjective:    Patient ID: Caitlin Williamson, female    DOB: [birth year], 37 y.o.   MRN: [id number]  HPI Routine office visit Elevated BP, last week was not feeling well, slightly anxious, the nurse at work check her BP and it was 179/100, has not been checked again. Anxiety, some stress lately, her son is moving to [place]. Take Xanax as needed, thinks works well for her, no need for further medication per patient. Asthma, good medication compliance, has been exposed to some dust lately and has needed albuterol but never more than 2 times a week.   Past Medical History: G1 P1 Asthma Cervical Ca---> s/p hysterectomy Allergic rhinitis Hypertension, mild  Past Surgical History: L4-L5 surgery (2000) Hysterectomy, no oophorectomy 2008 11 -2010, excision of a soft mass from the right shoulder Bx R breast (-)  Social History: Used to have a partner,   has a 75 year old son   Tobacco-- used to smoke lightly ETOH-- beer socially   interiors specialist for [employer]   Review of Systems Diet is regular . Exercise, active at work but no routine exercise, from time to time rides her bike No  chest pain or shortness or breath No  nausea, vomiting, diarrhea.    Objective:   Physical Exam BP 142/88  Pulse 55  Wt 151 lb (68.493 kg)  BMI 28.55 kg/m2  SpO2 99%  General -- alert, well-developed, NAD .   Neck --no thyromegaly Lungs -- normal respiratory effort, no intercostal retractions, no accessory muscle use, and normal breath sounds.   Heart-- normal rate, regular rhythm, no murmur, and no gallop.   Extremities-- no pretibial edema bilaterally  Neurologic-- alert & oriented X3 and strength normal in all extremities. Psych-- Cognition and judgment appear intact. Alert and cooperative with normal attention span and concentration.  not anxious appearing and not depressed appearing.       Assessment & Plan:

## 2012-12-26 NOTE — Assessment & Plan Note (Signed)
Resolved

## 2012-12-26 NOTE — Assessment & Plan Note (Signed)
Good compliance with Qvar and Singulair, uses albuterol about twice a week. No change

## 2012-12-26 NOTE — Patient Instructions (Addendum)
Start losartan 1 tablet daily. Minimize the use of Motrin, Aleve, Advil, ibuprofen Low-salt diet Please come back in 2 weeks for labs only (BMP--- dx  Hypertension) Check the  blood pressure 2 or 3 times a week, be sure it is between 110/60 and 140/85. If it is consistently higher or lower, let me know --- Next visit in 3 months, fasting for a physical exam

## 2012-12-27 ENCOUNTER — Encounter: Payer: Self-pay | Admitting: Internal Medicine

## 2013-05-15 ENCOUNTER — Other Ambulatory Visit: Payer: Self-pay

## 2013-05-28 ENCOUNTER — Other Ambulatory Visit: Payer: Self-pay | Admitting: Obstetrics and Gynecology

## 2013-05-28 DIAGNOSIS — Z1231 Encounter for screening mammogram for malignant neoplasm of breast: Secondary | ICD-10-CM

## 2013-06-09 LAB — HM PAP SMEAR

## 2013-07-25 ENCOUNTER — Telehealth: Payer: Self-pay

## 2013-07-25 NOTE — Telephone Encounter (Signed)
Medication and allergies:  Reviewed and updated  90 day supply/mail order: na Local pharmacy: [facility] [address]   Immunizations due:  Declines flu  A/P:   No changes to FH, PSH or Personal Hx Flu vaccine--did not get this season Tdap--2013 Pap--06/2013--will check on her results as she did not get a card from them [facility]--scheduled for 10/2013 CCS--02/2012--nml  To Discuss with Provider: Not at this time

## 2013-07-29 ENCOUNTER — Encounter: Payer: Managed Care, Other (non HMO) | Admitting: Internal Medicine

## 2013-07-30 ENCOUNTER — Ambulatory Visit (INDEPENDENT_AMBULATORY_CARE_PROVIDER_SITE_OTHER): Payer: Managed Care, Other (non HMO) | Admitting: Internal Medicine

## 2013-07-30 ENCOUNTER — Encounter: Payer: Self-pay | Admitting: Internal Medicine

## 2013-07-30 VITALS — BP 129/84 | HR 60 | Temp 97.9°F | Ht 61.2 in | Wt 155.0 lb

## 2013-07-30 DIAGNOSIS — Z23 Encounter for immunization: Secondary | ICD-10-CM

## 2013-07-30 DIAGNOSIS — Z0000 Encounter for general adult medical examination without abnormal findings: Secondary | ICD-10-CM

## 2013-07-30 DIAGNOSIS — F411 Generalized anxiety disorder: Secondary | ICD-10-CM

## 2013-07-30 DIAGNOSIS — I10 Essential (primary) hypertension: Secondary | ICD-10-CM

## 2013-07-30 DIAGNOSIS — J45909 Unspecified asthma, uncomplicated: Secondary | ICD-10-CM

## 2013-07-30 DIAGNOSIS — K219 Gastro-esophageal reflux disease without esophagitis: Secondary | ICD-10-CM

## 2013-07-30 HISTORY — DX: Gastro-esophageal reflux disease without esophagitis: K21.9

## 2013-07-30 LAB — COMPREHENSIVE METABOLIC PANEL
ALBUMIN: 4.1 g/dL (ref 3.5–5.2)
ALK PHOS: 57 U/L (ref 39–117)
ALT: 21 U/L (ref 0–35)
AST: 24 U/L (ref 0–37)
BILIRUBIN TOTAL: 0.7 mg/dL (ref 0.3–1.2)
BUN: 13 mg/dL (ref 6–23)
CO2: 25 meq/L (ref 19–32)
Calcium: 8.8 mg/dL (ref 8.4–10.5)
Chloride: 106 mEq/L (ref 96–112)
Creatinine, Ser: 0.8 mg/dL (ref 0.4–1.2)
GFR: 84.53 mL/min (ref >60.00)
GLUCOSE: 82 mg/dL (ref 70–99)
Potassium: 3.7 mEq/L (ref 3.5–5.1)
SODIUM: 137 meq/L (ref 135–145)
TOTAL PROTEIN: 7.3 g/dL (ref 6.0–8.3)

## 2013-07-30 LAB — CBC WITH DIFFERENTIAL/PLATELET
BASOS PCT: 0.5 % (ref 0.0–3.0)
Basophils Absolute: 0 10*3/uL (ref 0.0–0.1)
EOS PCT: 2.4 % (ref 0.0–5.0)
Eosinophils Absolute: 0.1 10*3/uL (ref 0.0–0.7)
HEMATOCRIT: 38.1 % (ref 36.0–46.0)
HEMOGLOBIN: 13 g/dL (ref 12.0–15.0)
LYMPHS ABS: 1.6 10*3/uL (ref 0.7–4.0)
Lymphocytes Relative: 29.9 % (ref 12.0–46.0)
MCHC: 34.1 g/dL (ref 30.0–36.0)
MCV: 95 fl (ref 78.0–100.0)
MONO ABS: 0.3 10*3/uL (ref 0.1–1.0)
MONOS PCT: 6.5 % (ref 3.0–12.0)
Neutro Abs: 3.2 10*3/uL (ref 1.4–7.7)
Neutrophils Relative %: 60.7 % (ref 43.0–77.0)
PLATELETS: 281 10*3/uL (ref 150.0–400.0)
RBC: 4.01 Mil/uL (ref 3.87–5.11)
RDW: 13.3 % (ref 11.5–14.6)
WBC: 5.2 10*3/uL (ref 4.5–10.5)

## 2013-07-30 LAB — LIPID PANEL
CHOLESTEROL: 170 mg/dL (ref 0–200)
HDL: 45.1 mg/dL (ref >39.00)
LDL Cholesterol: 112 mg/dL — ABNORMAL HIGH (ref 0–99)
TRIGLYCERIDES: 66 mg/dL (ref 0.0–149.0)
Total CHOL/HDL Ratio: 4
VLDL: 13.2 mg/dL (ref 0.0–40.0)

## 2013-07-30 LAB — TSH: TSH: 1.62 u[IU]/mL (ref 0.35–5.50)

## 2013-07-30 MED ORDER — ALPRAZOLAM 0.5 MG PO TABS: ORAL_TABLET | ORAL | Status: DC

## 2013-07-30 MED ORDER — OMEPRAZOLE 40 MG PO CPDR: 40.0000 mg | DELAYED_RELEASE_CAPSULE | Freq: Every day | ORAL | Status: DC

## 2013-07-30 NOTE — Patient Instructions (Signed)
Get your blood work before you leave   Next visit is for routine check up regards  hypertension, acid reflux  in 6 months , no fasting Please make an appointment     Diet for Gastroesophageal Reflux Disease, Adult Reflux (acid reflux) is when acid from your stomach flows up into the esophagus. When acid comes in contact with the esophagus, the acid causes irritation and soreness (inflammation) in the esophagus. When reflux happens often or so severely that it causes damage to the esophagus, it is called gastroesophageal reflux disease (GERD). Nutrition therapy can help ease the discomfort of GERD. FOODS OR DRINKS TO AVOID OR LIMIT  Smoking or chewing tobacco. Nicotine is one of the most potent stimulants to acid production in the gastrointestinal tract.  Caffeinated and decaffeinated coffee and black tea.  Regular or low-calorie carbonated beverages or energy drinks (caffeine-free carbonated beverages are allowed).   Strong spices, such as black pepper, white pepper, red pepper, cayenne, curry powder, and chili powder.  Peppermint or spearmint.  Chocolate.  High-fat foods, including meats and fried foods. Extra added fats including oils, butter, salad dressings, and nuts. Limit these to less than 8 tsp per day.  Fruits and vegetables if they are not tolerated, such as citrus fruits or tomatoes.  Alcohol.  Any food that seems to aggravate your condition. If you have questions regarding your diet, call your caregiver or a registered dietitian. OTHER THINGS THAT MAY HELP GERD INCLUDE:   Eating your meals slowly, in a relaxed setting.  Eating 5 to 6 small meals per day instead of 3 large meals.  Eliminating food for a period of time if it causes distress.  Not lying down until 3 hours after eating a meal.  Keeping the head of your bed raised 6 to 9 inches (15 to 23 cm) by using a foam wedge or blocks under the legs of the bed. Lying flat may make symptoms worse.  Being  physically active. Weight loss may be helpful in reducing reflux in overweight or obese adults.  Wear loose fitting clothing EXAMPLE MEAL PLAN This meal plan is approximately 2,000 calories based on [email or web address] meal planning guidelines. Breakfast   cup cooked oatmeal.  1 cup strawberries.  1 cup low-fat milk.  1 oz almonds. Snack  1 cup cucumber slices.  6 oz yogurt (made from low-fat or fat-free milk). Lunch  2 slice whole-wheat bread.  2 oz sliced [place].  2 tsp mayonnaise.  1 cup blueberries.  1 cup snap peas. Snack  6 whole-wheat crackers.  1 oz string cheese. Dinner   cup brown rice.  1 cup mixed veggies.  1 tsp olive oil.  3 oz grilled fish. Document Released: 06/26/2005 Document Revised: 09/18/2011 Document Reviewed: 05/12/2011 [facility] Patient Information 2014 [place], [place].

## 2013-07-30 NOTE — Assessment & Plan Note (Signed)
Reports heartburn at least twice a week, taking OTC TUMS. Recommend appropriate diet, omeprazole 40 mg, followup in 6 months

## 2013-07-30 NOTE — Progress Notes (Signed)
   Subjective:    Patient ID: Caitlin Williamson, female    DOB: 1976/04/07, 38 y.o.   MRN: [id number]  HPI  CPX  Past Medical History  Diagnosis Date  . Asthma   . Allergic rhinitis   . Cervical cancer 2008  . Mild HTN    Past Surgical History  Procedure Laterality Date  . Partial hysterectomy  2008    no oophorectomy  . Back surgery  1999    L4-L5  . Mass excision  05-2009    shoulder  right  . Breast biopsy      right   . Rotator cuff repair      right   History   Social History  . Marital Status: Single    Spouse Name: N/A    Number of Children: 1  . Years of Education: N/A   Occupational History  . [employer],travels a lot    .     Social History Main Topics  . Smoking status: Former Research scientist (life sciences)  . Smokeless tobacco: Never Used  . Alcohol Use: 10.8 oz/week    18 Cans of beer per week     Comment: socially  . Drug Use: No  . Sexual Activity: Not on file   Other Topics Concern  . Not on file   Social History Narrative   lives w/ a room mate   Family History  Problem Relation Age of Onset  . Diabetes Mother   . Hypertension Mother   . Breast cancer Maternal Grandmother   . Stroke Maternal Grandmother   . Colon cancer Neg Hx   . Heart disease Maternal Grandmother       Review of Systems Diet-- healthy most of the time Exercise-- very little  No  CP, SOB  Denies  nausea, vomiting diarrhea Denies  blood in the stools No dysphagia or odynophagia Asthma well controlled       Objective:   Physical Exam BP 129/84  Pulse 60  Temp(Src) 97.9 F (36.6 C)  Ht 5' 1.2" (1.554 m)  Wt 155 lb (70.308 kg)  BMI 29.11 kg/m2  SpO2 100%  General -- alert, well-developed, NAD.  Neck --no thyromegaly , normal carotid pulse Lungs -- normal respiratory effort, no intercostal retractions, no accessory muscle use, and normal breath sounds.  Heart-- normal rate, regular rhythm, no murmur.  Abdomen-- Not distended, good bowel sounds,soft,  non-tender. Extremities-- no pretibial edema bilaterally  Neurologic--  alert & oriented X3. Speech normal, gait normal, strength normal in all extremities.  Psych-- Cognition and judgment appear intact. Cooperative with normal attention span and concentration. No anxious or depressed appearing.     Assessment & Plan:

## 2013-07-30 NOTE — Assessment & Plan Note (Signed)
Well-controlled, uses albuterol rarely

## 2013-07-30 NOTE — Progress Notes (Signed)
Pre visit review using our clinic review tool, if applicable. No additional management support is needed unless otherwise documented below in the visit note. 

## 2013-07-30 NOTE — Assessment & Plan Note (Signed)
Well-controlled with Xanax as needed, UDS 06/2012 negative, UDS today

## 2013-07-30 NOTE — Assessment & Plan Note (Addendum)
Td 2013Flu shot today Sees gyn  MMG schedule 4-15 Had thyroid deficiency anemia, Cscope 02-2012 neg  Diet and exercise discussed Labs

## 2013-07-30 NOTE — Assessment & Plan Note (Signed)
On medications, BP today normal.

## 2013-08-05 ENCOUNTER — Telehealth: Payer: Self-pay

## 2013-08-05 NOTE — Telephone Encounter (Signed)
UDS: 07/30/2013 Negative for Alprazolam--PRN med Low risk per Dr Larose Kells

## 2013-08-13 ENCOUNTER — Telehealth: Payer: Self-pay | Admitting: Internal Medicine

## 2013-08-13 NOTE — Telephone Encounter (Signed)
Relevant patient education assigned to patient using Emmi. ° °

## 2013-10-01 ENCOUNTER — Other Ambulatory Visit: Payer: Self-pay | Admitting: Internal Medicine

## 2013-10-08 ENCOUNTER — Encounter: Payer: Self-pay | Admitting: Internal Medicine

## 2013-10-15 ENCOUNTER — Ambulatory Visit
Admission: RE | Admit: 2013-10-15 | Discharge: 2013-10-15 | Disposition: A | Discharge status: Home / Self Care | Payer: Managed Care, Other (non HMO) | Source: Ambulatory Visit | Attending: Obstetrics and Gynecology | Admitting: Obstetrics and Gynecology

## 2013-10-15 DIAGNOSIS — Z1231 Encounter for screening mammogram for malignant neoplasm of breast: Secondary | ICD-10-CM

## 2014-03-09 ENCOUNTER — Ambulatory Visit (INDEPENDENT_AMBULATORY_CARE_PROVIDER_SITE_OTHER): Payer: Managed Care, Other (non HMO) | Admitting: Internal Medicine

## 2014-03-09 ENCOUNTER — Encounter: Payer: Self-pay | Admitting: Internal Medicine

## 2014-03-09 VITALS — BP 128/68 | HR 57 | Temp 98.3°F | Wt 154.8 lb

## 2014-03-09 DIAGNOSIS — M7750 Other enthesopathy of unspecified foot: Secondary | ICD-10-CM

## 2014-03-09 DIAGNOSIS — M7740 Metatarsalgia, unspecified foot: Secondary | ICD-10-CM | POA: Insufficient documentation

## 2014-03-09 DIAGNOSIS — M7741 Metatarsalgia, right foot: Secondary | ICD-10-CM

## 2014-03-09 DIAGNOSIS — J45909 Unspecified asthma, uncomplicated: Secondary | ICD-10-CM

## 2014-03-09 DIAGNOSIS — I10 Essential (primary) hypertension: Secondary | ICD-10-CM

## 2014-03-09 DIAGNOSIS — R197 Diarrhea, unspecified: Secondary | ICD-10-CM

## 2014-03-09 LAB — BASIC METABOLIC PANEL
BUN: 12 mg/dL (ref 6–23)
CALCIUM: 9.1 mg/dL (ref 8.4–10.5)
CO2: 25 meq/L (ref 19–32)
Chloride: 106 mEq/L (ref 96–112)
Creatinine, Ser: 0.9 mg/dL (ref 0.4–1.2)
GFR: 78.57 mL/min (ref >60.00)
GLUCOSE: 99 mg/dL (ref 70–99)
Potassium: 4 mEq/L (ref 3.5–5.1)
Sodium: 137 mEq/L (ref 135–145)

## 2014-03-09 NOTE — Assessment & Plan Note (Signed)
Metatarsalgia, Pain in the left likely metatarsalgia, ddx --occcult fracture and morton's neuroma. We agreed to treat with metatarsal pads, gradually get to use it up to 8 h/qd;  if she is not better, ?x-rays and possibly sports meds referral.

## 2014-03-09 NOTE — Patient Instructions (Addendum)
Go to the lab , get the blood work and  stool  Containers, bring samples: Stool culture, C. Difficile, and WBCs --- dx diarrhea   Align (probiotic) one a day  Call if the diarrhea is not better in ~ 10 days   Go to [facility] in [place]: Get a L metatarsal pad Use it daily  Next visit 07-2014 for a physical

## 2014-03-09 NOTE — Progress Notes (Signed)
Pre visit review using our clinic review tool, if applicable. No additional management support is needed unless otherwise documented below in the visit note. 

## 2014-03-09 NOTE — Assessment & Plan Note (Signed)
Well controlled, currently not using any medication

## 2014-03-09 NOTE — Progress Notes (Signed)
Subjective:    Patient ID: Caitlin Williamson, female    DOB: Sep 03, 1975, 38 y.o.   MRN: [id number]  DOS:  03/09/2014 Type of visit - description : routine Interval history: --Hypertension, good medication compliance, BP today is very good. --Asthma, not needing any medication at this time. --Also, watery diarrhea 2 weeks ago, it started suddenly, denies any blood or mucus in the stools, she does have upper  abdominal epigastric pain 50% of the time. She travels a lot but within the [place] and is exposed to different foods. --Also several weeks history of pain at the left second toe base  ROS Denies fever, chills, weight loss. No recent antibiotic intake No  nausea, vomiting, GERD symptoms well controlled with PPIs. No recent antibiotic. No foot injury or swelling  Past Medical History  Diagnosis Date  . Asthma   . Allergic rhinitis   . Cervical cancer 2008  . Mild HTN   . GERD (gastroesophageal reflux disease) 07/30/2013  . ANXIETY 03/11/2010    Qualifier: Diagnosis of  By: Larose Kells MD, [place]     Past Surgical History  Procedure Laterality Date  . Partial hysterectomy  2008    no oophorectomy  . Back surgery  1999    L4-L5  . Mass excision  05-2009    shoulder  right  . Breast biopsy      right   . Rotator cuff repair      right    History   Social History  . Marital Status: Single    Spouse Name: N/A    Number of Children: 1  . Years of Education: N/A   Occupational History  . [employer],travels a lot    .     Social History Main Topics  . Smoking status: Former Research scientist (life sciences)  . Smokeless tobacco: Never Used  . Alcohol Use: 10.8 oz/week    18 Cans of beer per week     Comment: socially  . Drug Use: No  . Sexual Activity: Not on file   Other Topics Concern  . Not on file   Social History Narrative   lives w/ a room mate        Medication List       This list is accurate as of: 03/09/14  7:42 PM.  Always use your most recent med list.               albuterol 108 (90 BASE) MCG/ACT inhaler  Commonly known as:  VENTOLIN HFA  Inhale 2 puffs into the lungs every 6 (six) hours as needed for wheezing.     ALPRAZolam 0.5 MG tablet  Commonly known as:  XANAX  TAKE 1 TABLET BY MOUTH TWICE DAILY AS NEEDED (ANXIETY OR DIFFICULTY SLEEPING)     beclomethasone 80 MCG/ACT inhaler  Commonly known as:  QVAR  Inhale 2 puffs into the lungs 2 (two) times daily.     fluticasone 50 MCG/ACT nasal spray  Commonly known as:  FLONASE  Place 2 sprays into the nose daily.     ibuprofen 200 MG tablet  Commonly known as:  ADVIL,MOTRIN  Take 400-600 mg by mouth every 6 (six) hours as needed. For pain     losartan 50 MG tablet  Commonly known as:  COZAAR  TAKE 1 TABLET BY MOUTH EVERY DAY     montelukast 10 MG tablet  Commonly known as:  SINGULAIR  Take 1 tablet (10 mg total) by mouth at bedtime.  omeprazole 40 MG capsule  Commonly known as:  PRILOSEC  Take 1 capsule (40 mg total) by mouth daily.           Objective:   Physical Exam BP 128/68  Pulse 57  Temp(Src) 98.3 F (36.8 C) (Oral)  Wt 154 lb 12.2 oz (70.2 kg)  SpO2 98% General -- alert, well-developed, NAD.  HEENT-- Not pale.no jaundice Lungs -- normal respiratory effort, no intercostal retractions, no accessory muscle use, and normal breath sounds.  Heart-- normal rate, regular rhythm, no murmur.  Abdomen-- Not distended, good bowel sounds,soft, non-tender. No rebound or rigidity.   Extremities-- no pretibial edema bilaterally . Left foot is normal to inspection- palpation except for mild tenderness at the base of the second toe; vascular exam normal Neurologic--  alert & oriented X3. Speech normal, gait appropriate for age, strength symmetric and appropriate for age. Psych-- Cognition and judgment appear intact. Cooperative with normal attention span and concentration. No anxious or depressed appearing.        Assessment & Plan:  Diarrhea, 2 weeks history of diarrhea, still  considered acute. Etiology not completely clear, no recent antibiotics use Plan: Align, stools studie, call if no better in few days.

## 2014-03-09 NOTE — Assessment & Plan Note (Signed)
Controlled, check a BMP 

## 2014-03-11 ENCOUNTER — Other Ambulatory Visit (INDEPENDENT_AMBULATORY_CARE_PROVIDER_SITE_OTHER): Payer: Managed Care, Other (non HMO)

## 2014-03-11 ENCOUNTER — Other Ambulatory Visit: Payer: Self-pay | Admitting: Internal Medicine

## 2014-03-11 DIAGNOSIS — R197 Diarrhea, unspecified: Secondary | ICD-10-CM

## 2014-03-12 ENCOUNTER — Telehealth: Payer: Self-pay | Admitting: Internal Medicine

## 2014-03-12 ENCOUNTER — Other Ambulatory Visit: Payer: Self-pay | Admitting: Internal Medicine

## 2014-03-12 DIAGNOSIS — R197 Diarrhea, unspecified: Secondary | ICD-10-CM

## 2014-03-12 LAB — FECAL LACTOFERRIN, QUANT: Lactoferrin: NEGATIVE

## 2014-03-12 NOTE — Telephone Encounter (Signed)
Please advise 

## 2014-03-12 NOTE — Telephone Encounter (Signed)
[phone] Pt called back. Please return call.

## 2014-03-12 NOTE — Telephone Encounter (Signed)
Kaylyn talk w/ Vita Barley at our  lab ----> please give pt new containers for stool test, see below. Needs : stool culture and C. Difficile -- dx  diarrhea

## 2014-03-12 NOTE — Telephone Encounter (Signed)
Pt is requesting refill for Alprazolam.   Last OV: 03/09/2014 Last Fill: 07/30/2013 # 60 with 2 RF Last UDS: 07/30/2013 Low risk   Please Advise.

## 2014-03-12 NOTE — Telephone Encounter (Signed)
Caller name: Yolanda  Relation to pt: other / [employer] back number: [phone]   Reason for call:    Re collect c-diff in clean cup; no longer process in a cns vile.

## 2014-03-12 NOTE — Telephone Encounter (Signed)
Spoke with Pt, she will try to make it by this afternoon to get another cup, or she said she may have someone come by and get it for her.

## 2014-03-12 NOTE — Telephone Encounter (Signed)
FYI

## 2014-03-12 NOTE — Telephone Encounter (Signed)
LMOM for Pt to come to lab and receive another clean cup for C. Diff testing.

## 2014-03-13 ENCOUNTER — Other Ambulatory Visit: Payer: Self-pay | Admitting: Internal Medicine

## 2014-03-13 ENCOUNTER — Other Ambulatory Visit: Payer: Managed Care, Other (non HMO)

## 2014-03-13 NOTE — Telephone Encounter (Signed)
Medication faxed to Pharmacy.  

## 2014-03-14 LAB — C. DIFFICILE GDH AND TOXIN A/B
C. DIFF TOXIN A/B: NOT DETECTED
C. difficile GDH: NOT DETECTED

## 2014-03-15 LAB — STOOL CULTURE

## 2014-05-16 ENCOUNTER — Other Ambulatory Visit: Payer: Self-pay | Admitting: Internal Medicine

## 2014-07-27 ENCOUNTER — Other Ambulatory Visit: Payer: Self-pay

## 2014-07-27 MED ORDER — LOSARTAN POTASSIUM 50 MG PO TABS: 50.0000 mg | ORAL_TABLET | Freq: Every day | ORAL | Status: DC

## 2014-08-27 ENCOUNTER — Other Ambulatory Visit: Payer: Self-pay | Admitting: Internal Medicine

## 2014-11-08 ENCOUNTER — Other Ambulatory Visit: Payer: Self-pay | Admitting: Internal Medicine

## 2014-11-17 ENCOUNTER — Telehealth: Payer: Self-pay | Admitting: Internal Medicine

## 2014-11-17 MED ORDER — FLUTICASONE PROPIONATE 50 MCG/ACT NA SUSP: 2.0000 | Freq: Every day | NASAL | Status: DC

## 2014-11-17 MED ORDER — OMEPRAZOLE 40 MG PO CPDR: 40.0000 mg | DELAYED_RELEASE_CAPSULE | Freq: Every day | ORAL | Status: DC

## 2014-11-17 MED ORDER — LOSARTAN POTASSIUM 50 MG PO TABS: 50.0000 mg | ORAL_TABLET | Freq: Every day | ORAL | Status: DC

## 2014-11-17 MED ORDER — MONTELUKAST SODIUM 10 MG PO TABS: 10.0000 mg | ORAL_TABLET | Freq: Every day | ORAL | Status: DC

## 2014-11-17 MED ORDER — BECLOMETHASONE DIPROPIONATE 80 MCG/ACT IN AERS
2.0000 | INHALATION_SPRAY | Freq: Two times a day (BID) | RESPIRATORY_TRACT | Status: DC
Start: 2014-11-17 — End: 2015-09-27

## 2014-11-17 MED ORDER — ALPRAZOLAM 0.5 MG PO TABS: 0.5000 mg | ORAL_TABLET | Freq: Two times a day (BID) | ORAL | Status: DC | PRN

## 2014-11-17 MED ORDER — ALBUTEROL SULFATE HFA 108 (90 BASE) MCG/ACT IN AERS: 2.0000 | INHALATION_SPRAY | Freq: Four times a day (QID) | RESPIRATORY_TRACT | Status: DC | PRN

## 2014-11-17 NOTE — Telephone Encounter (Signed)
Relation to pt: self  Call back number: [phone] Pharmacy: [id number] - [place], [place] - [address] [phone] (Phone) [phone] (Fax)         Reason for call:  omeprazole (PRILOSEC) 40 MG capsule  fluticasone (FLONASE) 50 MCG/ACT nasal spray  albuterol (VENTOLIN HFA) 108 (90 BASE) MCG/ACT inhaler [[id number] beclomethasone (QVAR) 80 MCG/ACT inhaler  ALPRAZolam (XANAX) 0.5 MG tablet  montelukast (SINGULAIR) 10 MG tablet

## 2014-11-17 NOTE — Telephone Encounter (Signed)
Rx printed, awaiting MD signature.  

## 2014-11-17 NOTE — Telephone Encounter (Signed)
Pt is requesting a refill on Alprazolam.  Last OV: 03/09/2014, appt scheduled for 03/05/2015-CPE Last Fill: 03/13/2014 # 60 4RF UDS: 07/30/2013 Low risk  Please advise.    Omeprazole, Flonase, Albuterol, Qvar, and Singulair sent to [facility] [facility] as requested.

## 2014-11-17 NOTE — Telephone Encounter (Signed)
Okay 60 and 4 refills

## 2014-11-18 NOTE — Telephone Encounter (Signed)
Rx faxed to [facility]

## 2014-12-10 ENCOUNTER — Other Ambulatory Visit: Payer: Self-pay

## 2014-12-10 DIAGNOSIS — Z1231 Encounter for screening mammogram for malignant neoplasm of breast: Secondary | ICD-10-CM

## 2014-12-16 ENCOUNTER — Ambulatory Visit
Admission: RE | Admit: 2014-12-16 | Discharge: 2014-12-16 | Disposition: A | Discharge status: Home / Self Care | Payer: Managed Care, Other (non HMO) | Source: Ambulatory Visit

## 2014-12-16 DIAGNOSIS — Z1231 Encounter for screening mammogram for malignant neoplasm of breast: Secondary | ICD-10-CM

## 2015-01-26 ENCOUNTER — Ambulatory Visit (INDEPENDENT_AMBULATORY_CARE_PROVIDER_SITE_OTHER): Payer: Managed Care, Other (non HMO) | Admitting: Internal Medicine

## 2015-01-26 ENCOUNTER — Encounter: Payer: Self-pay | Admitting: Internal Medicine

## 2015-01-26 VITALS — BP 129/74 | HR 52 | Temp 98.2°F | Ht 61.2 in | Wt 154.2 lb

## 2015-01-26 DIAGNOSIS — R1011 Right upper quadrant pain: Secondary | ICD-10-CM | POA: Diagnosis not present

## 2015-01-26 DIAGNOSIS — I10 Essential (primary) hypertension: Secondary | ICD-10-CM

## 2015-01-26 DIAGNOSIS — J4520 Mild intermittent asthma, uncomplicated: Secondary | ICD-10-CM | POA: Diagnosis not present

## 2015-01-26 DIAGNOSIS — R399 Unspecified symptoms and signs involving the genitourinary system: Secondary | ICD-10-CM

## 2015-01-26 LAB — URINALYSIS, ROUTINE W REFLEX MICROSCOPIC
BILIRUBIN URINE: NEGATIVE
Hgb urine dipstick: NEGATIVE
Ketones, ur: NEGATIVE
LEUKOCYTES UA: NEGATIVE
NITRITE: NEGATIVE
RBC / HPF: NONE SEEN (ref 0–?)
Specific Gravity, Urine: <=1.005 — AB (ref 1.000–1.030)
Total Protein, Urine: NEGATIVE
UROBILINOGEN UA: 0.2 (ref 0.0–1.0)
Urine Glucose: NEGATIVE
pH: 6 (ref 5.0–8.0)

## 2015-01-26 LAB — POCT URINALYSIS DIPSTICK
Bilirubin, UA: NEGATIVE
Blood, UA: NEGATIVE
Glucose, UA: NEGATIVE
Ketones, UA: NEGATIVE
LEUKOCYTES UA: NEGATIVE
Nitrite, UA: NEGATIVE
PH UA: 7
Protein, UA: NEGATIVE
Spec Grav, UA: <=1.005
UROBILINOGEN UA: 0.2

## 2015-01-26 LAB — CBC WITH DIFFERENTIAL/PLATELET
Basophils Absolute: 0 10*3/uL (ref 0.0–0.1)
Basophils Relative: 0.4 % (ref 0.0–3.0)
Eosinophils Absolute: 0.1 10*3/uL (ref 0.0–0.7)
Eosinophils Relative: 1.4 % (ref 0.0–5.0)
HEMATOCRIT: 40.3 % (ref 36.0–46.0)
Hemoglobin: 13.8 g/dL (ref 12.0–15.0)
LYMPHS ABS: 1.8 10*3/uL (ref 0.7–4.0)
Lymphocytes Relative: 28.6 % (ref 12.0–46.0)
MCHC: 34.3 g/dL (ref 30.0–36.0)
MCV: 95.1 fl (ref 78.0–100.0)
Monocytes Absolute: 0.4 10*3/uL (ref 0.1–1.0)
Monocytes Relative: 5.9 % (ref 3.0–12.0)
NEUTROS ABS: 4.1 10*3/uL (ref 1.4–7.7)
Neutrophils Relative %: 63.7 % (ref 43.0–77.0)
Platelets: 286 10*3/uL (ref 150.0–400.0)
RBC: 4.24 Mil/uL (ref 3.87–5.11)
RDW: 13.9 % (ref 11.5–15.5)
WBC: 6.4 10*3/uL (ref 4.0–10.5)

## 2015-01-26 LAB — COMPREHENSIVE METABOLIC PANEL
ALK PHOS: 58 U/L (ref 39–117)
ALT: 17 U/L (ref 0–35)
AST: 20 U/L (ref 0–37)
Albumin: 4.2 g/dL (ref 3.5–5.2)
BILIRUBIN TOTAL: 0.9 mg/dL (ref 0.2–1.2)
BUN: 13 mg/dL (ref 6–23)
CALCIUM: 9.2 mg/dL (ref 8.4–10.5)
CHLORIDE: 104 meq/L (ref 96–112)
CO2: 26 mEq/L (ref 19–32)
Creatinine, Ser: 0.84 mg/dL (ref 0.40–1.20)
GFR: 79.3 mL/min (ref >60.00)
GLUCOSE: 93 mg/dL (ref 70–99)
POTASSIUM: 3.7 meq/L (ref 3.5–5.1)
SODIUM: 137 meq/L (ref 135–145)
TOTAL PROTEIN: 7 g/dL (ref 6.0–8.3)

## 2015-01-26 LAB — AMYLASE: Amylase: 45 U/L (ref 27–131)

## 2015-01-26 LAB — LIPASE: Lipase: 33 U/L (ref 11.0–59.0)

## 2015-01-26 NOTE — Progress Notes (Signed)
Subjective:    Patient ID: Caitlin Williamson, female    DOB: Sep 03, 1975, 39 y.o.   MRN: [id number]  DOS:  01/26/2015 Type of visit - description : Acute Interval history: Symptoms started last week with a sharp, epigastric abdominal pain, feels like a knot. Had symptoms 2 days last week and also today. Pain  is steady, no radiation, no change with food. She also has a lower abdominal pressure and some right-sided back pain, symptoms are worse when she is sitting for too long or standing. Both the upper and lower pain increased when she urinates.  Hypertension: Compliance of medication, BP today is very good Asthma: Good compliance of medication, no recent symptoms   Review of Systems  Denies fever chills No nausea, vomiting, diarrhea or blood in the stools. Bowel movements have been normal appearing and not daily. No dysuria gross hematuria No vaginal discharge or vaginal bleeding.   Past Medical History  Diagnosis Date  . Asthma   . Allergic rhinitis   . Cervical cancer 2008  . Mild HTN   . GERD (gastroesophageal reflux disease) 07/30/2013  . ANXIETY 03/11/2010    Qualifier: Diagnosis of  By: Larose Kells MD, [place]     Past Surgical History  Procedure Laterality Date  . Partial hysterectomy  2008    no oophorectomy  . Back surgery  1999    L4-L5  . Mass excision  05-2009    shoulder  right  . Breast biopsy      right   . Rotator cuff repair      right    History   Social History  . Marital Status: Single    Spouse Name: N/A  . Number of Children: 1  . Years of Education: N/A   Occupational History  . [employer],travels a lot    .     Social History Main Topics  . Smoking status: Former Research scientist (life sciences)  . Smokeless tobacco: Never Used  . Alcohol Use: 10.8 oz/week    18 Cans of beer per week     Comment: socially  . Drug Use: No  . Sexual Activity: Not on file   Other Topics Concern  . Not on file   Social History Narrative   lives w/ a room mate          Medication List       This list is accurate as of: 01/26/15 11:29 AM.  Always use your most recent med list.               albuterol 108 (90 BASE) MCG/ACT inhaler  Commonly known as:  VENTOLIN HFA  Inhale 2 puffs into the lungs every 6 (six) hours as needed for wheezing.     ALPRAZolam 0.5 MG tablet  Commonly known as:  XANAX  Take 1 tablet (0.5 mg total) by mouth 2 (two) times daily as needed for anxiety or sleep.     beclomethasone 80 MCG/ACT inhaler  Commonly known as:  QVAR  Inhale 2 puffs into the lungs 2 (two) times daily.     fluticasone 50 MCG/ACT nasal spray  Commonly known as:  FLONASE  Place 2 sprays into both nostrils daily.     ibuprofen 200 MG tablet  Commonly known as:  ADVIL,MOTRIN  Take 400-600 mg by mouth every 6 (six) hours as needed. For pain     losartan 50 MG tablet  Commonly known as:  COZAAR  Take 1 tablet (50 mg total) by  mouth daily.     montelukast 10 MG tablet  Commonly known as:  SINGULAIR  Take 1 tablet (10 mg total) by mouth at bedtime.     omeprazole 40 MG capsule  Commonly known as:  PRILOSEC  Take 1 capsule (40 mg total) by mouth daily.           Objective:   Physical Exam  Abdominal:     BP 129/74 mmHg  Pulse 52  Temp(Src) 98.2 F (36.8 C) (Oral)  Ht 5' 1.2" (1.554 m)  Wt 154 lb 4 oz (69.967 kg)  BMI 28.97 kg/m2  SpO2 98%    General:   Well developed, well nourished . NAD.  HEENT:  Normocephalic . Face symmetric, atraumatic Lungs:  CTA B Normal respiratory effort, no intercostal retractions, no accessory muscle use. Heart: RRR,  no murmur.  no pretibial edema bilaterally  Abdomen:  Not distended, soft, slightly tender at the epigastric area and right side of the abdomen without mass or rebound. See graphic. Normal bowel sounds. Skin: Not pale. Not jaundice Neurologic:  alert & oriented X3.  Speech normal, gait appropriate for age and unassisted Psych--  Cognition and judgment appear intact.  Cooperative  with normal attention span and concentration.  Behavior appropriate. No anxious or depressed appearing.    Assessment & Plan:    Abdominal pain Presents with upper abdominal pain and right upper quadrant tenderness to palpation. She also has lower abdominal pressure-like symptoms, she has no urinary symptoms, Udip is negative. Lower abdomen pain seems positional, MSK?  DDX for upper abdominal pain includes PUD, gallbladder disease vs others Plan: Labs, UA, ultrasound of the abdomen. See instructions  Hypertension: Good compliance of medication, check a CMP.  Asthma, symptoms well-controlled  Due for a CPX, she does have an appointment scheduled for next month.

## 2015-01-26 NOTE — Progress Notes (Signed)
Pre visit review using our clinic review tool, if applicable. No additional management support is needed unless otherwise documented below in the visit note. 

## 2015-01-26 NOTE — Patient Instructions (Addendum)
Get your blood work before you leave   Drink plenty of clear fluids  For the next 5 days, take 1 Prilosec in the morning and one before dinner, after that take your usual Prilosec every morning  Okay to take Tylenol as needed for pain, trying to avoid Motrin or Motrin-like medications  Call anytime if you have severe symptoms, fever, chills also call if you're not improving in the next 10 days

## 2015-01-27 ENCOUNTER — Telehealth: Payer: Self-pay | Admitting: Internal Medicine

## 2015-01-27 ENCOUNTER — Other Ambulatory Visit: Payer: Managed Care, Other (non HMO)

## 2015-01-27 DIAGNOSIS — R8271 Bacteriuria: Principal | ICD-10-CM

## 2015-01-27 DIAGNOSIS — O9989 Other specified diseases and conditions complicating pregnancy, childbirth and the puerperium: Principal | ICD-10-CM

## 2015-01-27 DIAGNOSIS — O99892 Other specified diseases and conditions complicating childbirth: Secondary | ICD-10-CM

## 2015-01-27 NOTE — Telephone Encounter (Signed)
Recommend observation today and tomorrow, if by Thursday afternoon is not improving please call, will get a CT. Call anytime if symptoms increase

## 2015-01-27 NOTE — Telephone Encounter (Signed)
Advise patient, labs are very good (urine culture pending) ultrasound of the abdomen was negative. Plan: Increase Prilosec as recommended, call if not better in few days, call anytime if symptoms severe, fever, chills.

## 2015-01-27 NOTE — Telephone Encounter (Signed)
Pt informed and verbalized understanding

## 2015-01-27 NOTE — Telephone Encounter (Signed)
Spoke with Pt, informed her of ultrasound results. Informed her that we are sending her urine for culture to see if any significant bacteria grows. Instructed her in the meantime to try increasing the Prilosec as recommended by Dr. Larose Kells. Pt wanted to inform that pain is still constant but is now described to be "mid-abdomen", Pt denies constipation or diarrhea, and still having frequent urination. I informed her I would let Dr. Larose Kells know of her symptoms. Pt verbalized understanding.

## 2015-01-28 ENCOUNTER — Ambulatory Visit (HOSPITAL_BASED_OUTPATIENT_CLINIC_OR_DEPARTMENT_OTHER): Payer: Managed Care, Other (non HMO)

## 2015-01-28 LAB — URINE CULTURE
Colony Count: NO GROWTH
Organism ID, Bacteria: NO GROWTH

## 2015-02-10 ENCOUNTER — Telehealth: Payer: Self-pay

## 2015-02-10 NOTE — Telephone Encounter (Signed)
UDS: 01/26/2015   Negative for Alprazolam:PRN   Low risk per Dr. Larose Kells 02/10/2015

## 2015-02-26 ENCOUNTER — Other Ambulatory Visit: Payer: Self-pay

## 2015-02-26 ENCOUNTER — Other Ambulatory Visit: Payer: Self-pay | Admitting: Internal Medicine

## 2015-02-26 MED ORDER — OMEPRAZOLE 40 MG PO CPDR: 40.0000 mg | DELAYED_RELEASE_CAPSULE | Freq: Every day | ORAL | Status: DC

## 2015-03-04 ENCOUNTER — Telehealth: Payer: Self-pay | Admitting: Behavioral Health

## 2015-03-04 NOTE — Telephone Encounter (Signed)
Unable to reach patient at time of Pre-Visit Call.  Left message for patient to return call when available.    

## 2015-03-05 ENCOUNTER — Encounter: Payer: Self-pay | Admitting: Internal Medicine

## 2015-03-05 ENCOUNTER — Ambulatory Visit (INDEPENDENT_AMBULATORY_CARE_PROVIDER_SITE_OTHER): Payer: Managed Care, Other (non HMO) | Admitting: Internal Medicine

## 2015-03-05 VITALS — BP 116/74 | HR 68 | Temp 98.0°F | Ht 61.0 in | Wt 152.0 lb

## 2015-03-05 DIAGNOSIS — Z0000 Encounter for general adult medical examination without abnormal findings: Secondary | ICD-10-CM

## 2015-03-05 DIAGNOSIS — Z23 Encounter for immunization: Secondary | ICD-10-CM | POA: Diagnosis not present

## 2015-03-05 DIAGNOSIS — F411 Generalized anxiety disorder: Secondary | ICD-10-CM

## 2015-03-05 LAB — LIPID PANEL
CHOL/HDL RATIO: 4
Cholesterol: 195 mg/dL (ref 0–200)
HDL: 54.2 mg/dL (ref >39.00)
LDL Cholesterol: 124 mg/dL — ABNORMAL HIGH (ref 0–99)
NONHDL: 140.64
Triglycerides: 85 mg/dL (ref 0.0–149.0)
VLDL: 17 mg/dL (ref 0.0–40.0)

## 2015-03-05 NOTE — Assessment & Plan Note (Addendum)
Td 2013; pnm 23: 02-2015 ;  prevnar- discuss next year Flu shot recommended this fall  Sees gyn, due for a visit   MMG 12-2014 neg Had iron deficiency anemia, Cscope 02-2012 neg  Diet and exercise discussed Labs  EKG sinus rhythm

## 2015-03-05 NOTE — Progress Notes (Signed)
Pre visit review using our clinic review tool, if applicable. No additional management support is needed unless otherwise documented below in the visit note. 

## 2015-03-05 NOTE — Progress Notes (Signed)
Subjective:    Patient ID: Caitlin Williamson, female    DOB: 1975-07-29, 39 y.o.   MRN: [id number]  DOS:  03/05/2015 Type of visit - description : Complete physical exam Interval history: Good compliance w/ medication, chronic medical problems (Asthma hypertension ) seem well-controlled. She continue with some anxiety, counseling?.    Review of Systems Constitutional: No fever. No chills. No unexplained wt changes. No unusual sweats  HEENT: No dental problems, no ear discharge, no facial swelling, no voice changes. No eye discharge, no eye  redness , no  intolerance to light   Respiratory: No wheezing , no  difficulty breathing. No cough , no mucus production  Cardiovascular: No CP, no leg swelling , no  Palpitations  GI: no nausea, no vomiting, no diarrhea , no  abdominal pain.  No blood in the stools. No dysphagia, no odynophagia    Endocrine: No polyphagia, no polyuria , no polydipsia  GU: No dysuria, gross hematuria, difficulty urinating. No urinary urgency, no frequency.  Musculoskeletal: No joint swellings or unusual aches or pains  Skin: No change in the color of the skin, palor , no  Rash  Allergic, immunologic: No environmental allergies , no  food allergies  Neurological: No dizziness no  syncope. No headaches. No diplopia, no slurred, no slurred speech, no motor deficits, no facial  Numbness  Hematological: No enlarged lymph nodes, no easy bruising , no unusual bleedings  Psychiatry: No suicidal ideas, no hallucinations, no beavior problems, no confusion.   no depression   Past Medical History  Diagnosis Date  . Asthma   . Allergic rhinitis   . Cervical cancer 2008  . Mild HTN   . GERD (gastroesophageal reflux disease) 07/30/2013  . ANXIETY 03/11/2010    Qualifier: Diagnosis of  By: Larose Kells MD, [place]     Past Surgical History  Procedure Laterality Date  . Partial hysterectomy  2008    no oophorectomy  . Back surgery  1999    L4-L5  . Mass excision   05-2009    shoulder  right  . Breast biopsy      right   . Rotator cuff repair      right    Social History   Social History  . Marital Status: Single    Spouse Name: N/A  . Number of Children: 1  . Years of Education: N/A   Occupational History  . [employer],travels a lot    .     Social History Main Topics  . Smoking status: Former Research scientist (life sciences)  . Smokeless tobacco: Never Used  . Alcohol Use: 10.8 oz/week    18 Cans of beer per week     Comment: socially  . Drug Use: No  . Sexual Activity: Not on file   Other Topics Concern  . Not on file   Social History Narrative   lives w/ a room mate     Family History  Problem Relation Age of Onset  . Diabetes Mother   . Hypertension Mother   . Breast cancer Maternal Grandmother   . Stroke Maternal Grandmother   . Colon cancer Neg Hx   . Heart disease Maternal Grandmother        Medication List       This list is accurate as of: 03/05/15 11:59 PM.  Always use your most recent med list.               albuterol 108 (90 BASE) MCG/ACT inhaler  Commonly known as:  VENTOLIN HFA  Inhale 2 puffs into the lungs every 6 (six) hours as needed for wheezing.     ALPRAZolam 0.5 MG tablet  Commonly known as:  XANAX  Take 1 tablet (0.5 mg total) by mouth 2 (two) times daily as needed for anxiety or sleep.     beclomethasone 80 MCG/ACT inhaler  Commonly known as:  QVAR  Inhale 2 puffs into the lungs 2 (two) times daily.     fluticasone 50 MCG/ACT nasal spray  Commonly known as:  FLONASE  Place 2 sprays into both nostrils daily.     ibuprofen 200 MG tablet  Commonly known as:  ADVIL,MOTRIN  Take 400-600 mg by mouth every 6 (six) hours as needed. For pain     losartan 50 MG tablet  Commonly known as:  COZAAR  Take 1 tablet (50 mg total) by mouth daily.     montelukast 10 MG tablet  Commonly known as:  SINGULAIR  Take 1 tablet (10 mg total) by mouth at bedtime.     omeprazole 40 MG capsule  Commonly known as:   PRILOSEC  Take 1 capsule (40 mg total) by mouth daily.           Objective:   Physical Exam BP 116/74 mmHg  Pulse 68  Temp(Src) 98 F (36.7 C) (Oral)  Ht 5\' 1"  (1.549 m)  Wt 152 lb (68.947 kg)  BMI 28.74 kg/m2  SpO2 97% General:   Well developed, well nourished . NAD.  HEENT:  Normocephalic . Face symmetric, atraumatic Neck: No thyromegaly Lungs:  CTA B Normal respiratory effort, no intercostal retractions, no accessory muscle use. Heart: RRR,  no murmur.  no pretibial edema bilaterally  Abdomen:  Not distended, soft, non-tender. No rebound or rigidity. No mass,organomegaly Skin: Not pale. Not jaundice Neurologic:  alert & oriented X3.  Speech normal, gait appropriate for age and unassisted Psych--  Cognition and judgment appear intact.  Cooperative with normal attention span and concentration.  Behavior appropriate. No anxious or depressed appearing.    Assessment & Plan:  Hypertension: Controlled, last BMP satisfactory  Asthma: Controlled, continue with present care  Abdominal pain: After the last visit, pain resolved quickly, it has resurface wonders at times but much lower scale. Ultrasound of the abdomen was negative. Recommend observation. Next visit 6-8 months

## 2015-03-05 NOTE — Patient Instructions (Signed)
  Get your blood work before you leave     Next visit in 8 months  for a routine checkup   Please schedule an appointment at the front desk  No need to fast

## 2015-03-05 NOTE — Assessment & Plan Note (Signed)
Sleeping well with Xanax at night but has some stress at home, wonders if counseling is a good idea, I definitely agree, information about our counselors provided . Recommend to call if she feels she needs more help (SSRIs?)

## 2015-03-06 LAB — HIV ANTIBODY (ROUTINE TESTING W REFLEX): HIV 1&2 Ab, 4th Generation: NONREACTIVE

## 2015-03-31 ENCOUNTER — Other Ambulatory Visit: Payer: Self-pay | Admitting: Internal Medicine

## 2015-05-10 ENCOUNTER — Other Ambulatory Visit: Payer: Self-pay

## 2015-05-10 MED ORDER — OMEPRAZOLE 40 MG PO CPDR: 40.0000 mg | DELAYED_RELEASE_CAPSULE | Freq: Every day | ORAL | Status: DC

## 2015-06-10 ENCOUNTER — Other Ambulatory Visit: Payer: Self-pay | Admitting: Internal Medicine

## 2015-06-10 NOTE — Telephone Encounter (Signed)
Okay 60 and 4 refills 

## 2015-06-10 NOTE — Telephone Encounter (Signed)
Pt is requesting refill on Alprazolam.  Last OV: 03/05/2015 Last Fill: 5/10/201 #60 and 4RF UDS: 01/26/2015 Low risk  Please advise.

## 2015-06-10 NOTE — Telephone Encounter (Signed)
Rx faxed to [facility] pharmacy.  

## 2015-08-12 ENCOUNTER — Ambulatory Visit (INDEPENDENT_AMBULATORY_CARE_PROVIDER_SITE_OTHER): Payer: Managed Care, Other (non HMO) | Admitting: Behavioral Health

## 2015-08-12 DIAGNOSIS — Z23 Encounter for immunization: Secondary | ICD-10-CM

## 2015-08-12 NOTE — Progress Notes (Signed)
Pre visit review using our clinic review tool, if applicable. No additional management support is needed unless otherwise documented below in the visit note. 

## 2015-09-27 ENCOUNTER — Other Ambulatory Visit: Payer: Self-pay

## 2015-09-27 MED ORDER — BECLOMETHASONE DIPROPIONATE 80 MCG/ACT IN AERS: 2.0000 | INHALATION_SPRAY | Freq: Two times a day (BID) | RESPIRATORY_TRACT | Status: DC

## 2015-10-01 ENCOUNTER — Other Ambulatory Visit: Payer: Self-pay | Admitting: Internal Medicine

## 2015-12-10 ENCOUNTER — Other Ambulatory Visit: Payer: Self-pay

## 2015-12-10 DIAGNOSIS — Z1231 Encounter for screening mammogram for malignant neoplasm of breast: Secondary | ICD-10-CM

## 2015-12-20 ENCOUNTER — Ambulatory Visit: Payer: Managed Care, Other (non HMO)

## 2015-12-27 ENCOUNTER — Encounter: Payer: Self-pay | Admitting: Physician Assistant

## 2015-12-27 ENCOUNTER — Ambulatory Visit (INDEPENDENT_AMBULATORY_CARE_PROVIDER_SITE_OTHER): Payer: Managed Care, Other (non HMO) | Admitting: Physician Assistant

## 2015-12-27 VITALS — BP 112/86 | HR 80 | Temp 98.6°F | Resp 16 | Ht 61.0 in | Wt 150.2 lb

## 2015-12-27 DIAGNOSIS — J208 Acute bronchitis due to other specified organisms: Principal | ICD-10-CM

## 2015-12-27 DIAGNOSIS — B9689 Other specified bacterial agents as the cause of diseases classified elsewhere: Secondary | ICD-10-CM

## 2015-12-27 DIAGNOSIS — J00 Acute nasopharyngitis [common cold]: Secondary | ICD-10-CM | POA: Diagnosis not present

## 2015-12-27 MED ORDER — AZITHROMYCIN 250 MG PO TABS: ORAL_TABLET | ORAL | Status: DC

## 2015-12-27 MED ORDER — BENZONATATE 100 MG PO CAPS: 100.0000 mg | ORAL_CAPSULE | Freq: Three times a day (TID) | ORAL | Status: DC | PRN

## 2015-12-27 NOTE — Progress Notes (Signed)
Patient presents to clinic today c/o progressively worsening chest congestion with cough that was initially dry but is now productive of a yellow-green sputum. Endorses aches intermittently. Endorses fatigue. Endorses chest tenderness and tightness with coughing. Denies fever, chills. Denies recent travel or sick contact .   Past Medical History  Diagnosis Date  . Asthma   . Allergic rhinitis   . Cervical cancer ([place]) 2008  . Mild HTN   . GERD (gastroesophageal reflux disease) 07/30/2013  . ANXIETY 03/11/2010    Qualifier: Diagnosis of  By: Larose Kells MD, [facility]th[facility]     Current Outpatient Prescriptions on File Prior to Visit  Medication Sig Dispense Refill  . albuterol (VENTOLIN HFA) 108 (90 BASE) MCG/ACT inhaler Inhale 2 puffs into the lungs every 6 (six) hours as needed for wheezing. 1 Inhaler 1  . ALPRAZolam (XANAX) 0.5 MG tablet Take 1 tablet (0.5 mg total) by mouth 2 (two) times daily as needed for anxiety or sleep. 60 tablet 4  . beclomethasone (QVAR) 80 MCG/ACT inhaler Inhale 2 puffs into the lungs 2 (two) times daily. 1 Inhaler 5  . fluticasone (FLONASE) 50 MCG/ACT nasal spray Place 2 sprays into both nostrils daily. 16 g 6  . ibuprofen (ADVIL,MOTRIN) 200 MG tablet Take 400-600 mg by mouth every 6 (six) hours as needed. For pain    . losartan (COZAAR) 50 MG tablet Take 1 tablet (50 mg total) by mouth daily. 90 tablet 2  . montelukast (SINGULAIR) 10 MG tablet Take 1 tablet (10 mg total) by mouth at bedtime. 30 tablet 3  . omeprazole (PRILOSEC) 40 MG capsule Take 1 capsule (40 mg total) by mouth daily. 30 capsule 12   No current facility-administered medications on file prior to visit.    Allergies  Allergen Reactions  . Penicillins Anaphylaxis  . Sulfonamide Derivatives Anaphylaxis    Family History  Problem Relation Age of Onset  . Diabetes Mother   . Hypertension Mother   . Breast cancer Maternal Grandmother   . Stroke Maternal Grandmother   . Colon cancer Neg Hx   . Heart  disease Maternal Grandmother     Social History   Social History  . Marital Status: Single    Spouse Name: N/A  . Number of Children: 1  . Years of Education: N/A   Occupational History  . [employer],travels a lot    .     Social History Main Topics  . Smoking status: Former Research scientist (life sciences)  . Smokeless tobacco: Never Used  . Alcohol Use: 10.8 oz/week    18 Cans of beer per week     Comment: socially  . Drug Use: No  . Sexual Activity: Not Asked   Other Topics Concern  . None   Social History Narrative   lives w/ a room mate   Review of Systems - See HPI.  All other ROS are negative.  BP 112/86 mmHg  Pulse 80  Temp(Src) 98.6 F (37 C) (Oral)  Resp 16  Ht 5\' 1"  (1.549 m)  Wt 150 lb 4 oz (68.153 kg)  BMI 28.40 kg/m2  SpO2 98%  Physical Exam  Constitutional: She is oriented to person, place, and time and well-developed, well-nourished, and in no distress.  HENT:  Head: Normocephalic and atraumatic.  Right Ear: External ear normal.  Left Ear: External ear normal.  Nose: Nose normal.  Mouth/Throat: Oropharynx is clear and moist.  TM within normal limits  Eyes: Conjunctivae are normal.  Neck: Neck supple.  Cardiovascular: Normal rate, regular rhythm, normal heart sounds and intact distal pulses.   Pulmonary/Chest: Effort normal and breath sounds normal. No respiratory distress. She has no wheezes. She has no rales. She exhibits no tenderness.  Lymphadenopathy:    She has no cervical adenopathy.  Neurological: She is alert and oriented to person, place, and time.  Skin: Skin is warm and dry. No rash noted.  Psychiatric: Affect normal.   Assessment/Plan: 1. Acute bacterial bronchitis Without signs of asthma exacerbation. Rx Azithromycin.  Increase fluids.  Rest.  Saline nasal spray.  Probiotic.  Mucinex as directed.  Humidifier in bedroom. Tessalon for cough. Continue asthma medications as directed.  Call or return to clinic if symptoms are not improving.  -  azithromycin (ZITHROMAX) 250 MG tablet; Take 2 tablets on Day 1. Then take 1 tablet daily  Dispense: 6 tablet; Refill: 0 - benzonatate (TESSALON) 100 MG capsule; Take 1 capsule (100 mg total) by mouth 3 (three) times daily as needed.  Dispense: 30 capsule; Refill: 0   Leeanne Rio, [place]

## 2015-12-27 NOTE — Patient Instructions (Signed)
Take antibiotic (Azithromycin) as directed.  Increase fluids.  Get plenty of rest. Use Mucinex for congestion. Tessalon as directed for cough. Take a daily probiotic (I recommend Align or Culturelle, but even Activia Yogurt may be beneficial).  A humidifier placed in the bedroom may offer some relief for a dry, scratchy throat of nasal irritation.  Read information below on acute bronchitis. Please call or return to clinic if symptoms are not improving.  Acute Bronchitis Bronchitis is when the airways that extend from the windpipe into the lungs get red, puffy, and painful (inflamed). Bronchitis often causes thick spit (mucus) to develop. This leads to a cough. A cough is the most common symptom of bronchitis. In acute bronchitis, the condition usually begins suddenly and goes away over time (usually in 2 weeks). Smoking, allergies, and asthma can make bronchitis worse. Repeated episodes of bronchitis may cause more lung problems.  HOME CARE  Rest.  Drink enough fluids to keep your pee (urine) clear or pale yellow (unless you need to limit fluids as told by your doctor).  Only take over-the-counter or prescription medicines as told by your doctor.  Avoid smoking and secondhand smoke. These can make bronchitis worse. If you are a smoker, think about using nicotine gum or skin patches. Quitting smoking will help your lungs heal faster.  Reduce the chance of getting bronchitis again by:  Washing your hands often.  Avoiding people with cold symptoms.  Trying not to touch your hands to your mouth, nose, or eyes.  Follow up with your doctor as told.  GET HELP IF: Your symptoms do not improve after 1 week of treatment. Symptoms include:  Cough.  Fever.  Coughing up thick spit.  Body aches.  Chest congestion.  Chills.  Shortness of breath.  Sore throat.  GET HELP RIGHT AWAY IF:   You have an increased fever.  You have chills.  You have severe shortness of breath.  You  have bloody thick spit (sputum).  You throw up (vomit) often.  You lose too much body fluid (dehydration).  You have a severe headache.  You faint.  MAKE SURE YOU:   Understand these instructions.  Will watch your condition.  Will get help right away if you are not doing well or get worse. Document Released: 12/13/2007 Document Revised: 02/26/2013 Document Reviewed: 12/17/2012 ExitCare Patient Information 2015 [employer]. This information is not intended to replace advice given to you by your health care provider. Make sure you discuss any questions you have with your health care provider.   

## 2015-12-30 ENCOUNTER — Other Ambulatory Visit: Payer: Self-pay | Admitting: Obstetrics and Gynecology

## 2015-12-30 ENCOUNTER — Ambulatory Visit
Admission: RE | Admit: 2015-12-30 | Discharge: 2015-12-30 | Disposition: A | Discharge status: Home / Self Care | Payer: Managed Care, Other (non HMO) | Source: Ambulatory Visit

## 2015-12-30 DIAGNOSIS — Z1231 Encounter for screening mammogram for malignant neoplasm of breast: Secondary | ICD-10-CM

## 2015-12-30 DIAGNOSIS — D069 Carcinoma in situ of cervix, unspecified: Secondary | ICD-10-CM | POA: Insufficient documentation

## 2016-01-22 ENCOUNTER — Other Ambulatory Visit: Payer: Self-pay | Admitting: Internal Medicine

## 2016-01-25 ENCOUNTER — Telehealth: Payer: Self-pay | Admitting: Internal Medicine

## 2016-01-25 MED ORDER — ALBUTEROL SULFATE HFA 108 (90 BASE) MCG/ACT IN AERS: 2.0000 | INHALATION_SPRAY | Freq: Four times a day (QID) | RESPIRATORY_TRACT | Status: DC | PRN

## 2016-01-25 MED ORDER — MONTELUKAST SODIUM 10 MG PO TABS: 10.0000 mg | ORAL_TABLET | Freq: Every day | ORAL | Status: DC

## 2016-01-25 MED ORDER — BECLOMETHASONE DIPROPIONATE 80 MCG/ACT IN AERS: 2.0000 | INHALATION_SPRAY | Freq: Two times a day (BID) | RESPIRATORY_TRACT | Status: DC

## 2016-01-25 MED ORDER — ALPRAZOLAM 0.5 MG PO TABS: 0.5000 mg | ORAL_TABLET | Freq: Two times a day (BID) | ORAL | Status: DC | PRN

## 2016-01-25 MED ORDER — FLUTICASONE PROPIONATE 50 MCG/ACT NA SUSP: 2.0000 | Freq: Every day | NASAL | Status: DC

## 2016-01-25 NOTE — Telephone Encounter (Signed)
Rx printed, awaiting MD signature.  

## 2016-01-25 NOTE — Telephone Encounter (Signed)
Okay #60, no refills. Due for a CPX, please arrange within the next couple months

## 2016-01-25 NOTE — Telephone Encounter (Signed)
Pt is requesting refill on Alprazolam.  Last OV: 03/05/2015, no future appt scheduled Last Fill: 06/10/2015 #60 and 4RF UDS: 01/26/2015 Low risk  Please advise.   Singulair, Flonase, Albuterol, and [facility]var [facility].

## 2016-01-25 NOTE — Telephone Encounter (Signed)
°  Relationship to patient: Self Can be reached: [phone]  Pharmacy:  [id number] [place], [place] [phone] (Phone) [phone] (Fax         Reason for call: Request refill on montelukast (SINGULAIR) 10 MG tablet [id number]    fluticasone (FLONASE) 50 MCG/ACT nasal spray [id number]      albuterol (VENTOLIN HFA) 108 (90 BASE) MCG/ACT inhaler [id number]   beclomethasone (QVAR) 80 MCG/ACT inhaler [id number]   ALPRAZolam (XANAX) 0.5 MG tablet [id number]

## 2016-01-25 NOTE — Telephone Encounter (Signed)
[facility]. Pt due for CPE, no further refills until seen per Dr. Larose Kells. Please schedule at Pt's convenience. Thank you.

## 2016-01-26 NOTE — Telephone Encounter (Signed)
Left message on VM informing patient of below. Informed patient to call the office to schedule CPE in order to get future refills

## 2016-02-28 ENCOUNTER — Ambulatory Visit: Payer: Managed Care, Other (non HMO) | Admitting: Internal Medicine

## 2016-04-17 ENCOUNTER — Encounter: Payer: Self-pay | Admitting: Internal Medicine

## 2016-04-28 ENCOUNTER — Encounter: Payer: Managed Care, Other (non HMO) | Admitting: Internal Medicine

## 2016-05-15 ENCOUNTER — Encounter: Payer: Self-pay | Admitting: Physician Assistant

## 2016-05-29 ENCOUNTER — Other Ambulatory Visit: Payer: Self-pay | Admitting: Internal Medicine

## 2016-05-29 NOTE — Telephone Encounter (Signed)
Rx printed, awaiting MD signature.  

## 2016-05-29 NOTE — Telephone Encounter (Signed)
Okay #60, no refills 

## 2016-05-29 NOTE — Telephone Encounter (Signed)
[facility] to [facility].  

## 2016-05-29 NOTE — Telephone Encounter (Signed)
Pt is requesting refill on Alprazolam.  Last OV: 03/05/2015, cpe scheduled 07/11/2016 Last Fill: 01/25/2016 #60 and 0RF UDS: 01/26/2015 Low risk  Please advise.

## 2016-07-11 ENCOUNTER — Encounter: Payer: Self-pay | Admitting: Internal Medicine

## 2016-07-11 ENCOUNTER — Ambulatory Visit (INDEPENDENT_AMBULATORY_CARE_PROVIDER_SITE_OTHER): Payer: Commercial Managed Care - PPO | Admitting: Internal Medicine

## 2016-07-11 VITALS — BP 122/78 | HR 68 | Temp 97.8°F | Resp 14 | Ht 61.0 in | Wt 153.5 lb

## 2016-07-11 DIAGNOSIS — F411 Generalized anxiety disorder: Secondary | ICD-10-CM

## 2016-07-11 DIAGNOSIS — Z23 Encounter for immunization: Secondary | ICD-10-CM

## 2016-07-11 DIAGNOSIS — Z1211 Encounter for screening for malignant neoplasm of colon: Secondary | ICD-10-CM | POA: Insufficient documentation

## 2016-07-11 DIAGNOSIS — Z0000 Encounter for general adult medical examination without abnormal findings: Secondary | ICD-10-CM | POA: Diagnosis not present

## 2016-07-11 DIAGNOSIS — Z09 Encounter for follow-up examination after completed treatment for conditions other than malignant neoplasm: Secondary | ICD-10-CM | POA: Insufficient documentation

## 2016-07-11 MED ORDER — OMEPRAZOLE 40 MG PO CPDR: 40.0000 mg | DELAYED_RELEASE_CAPSULE | Freq: Every day | ORAL | 2 refills | Status: DC

## 2016-07-11 MED ORDER — MONTELUKAST SODIUM 10 MG PO TABS: 10.0000 mg | ORAL_TABLET | Freq: Every day | ORAL | 2 refills | Status: DC

## 2016-07-11 MED ORDER — FLUTICASONE PROPIONATE 50 MCG/ACT NA SUSP: 2.0000 | Freq: Every day | NASAL | 5 refills | Status: DC

## 2016-07-11 MED ORDER — ALBUTEROL SULFATE HFA 108 (90 BASE) MCG/ACT IN AERS: 2.0000 | INHALATION_SPRAY | Freq: Four times a day (QID) | RESPIRATORY_TRACT | 5 refills | Status: DC | PRN

## 2016-07-11 MED ORDER — ALPRAZOLAM 0.5 MG PO TABS: 0.5000 mg | ORAL_TABLET | Freq: Two times a day (BID) | ORAL | 2 refills | Status: DC | PRN

## 2016-07-11 MED ORDER — LOSARTAN POTASSIUM 50 MG PO TABS: 50.0000 mg | ORAL_TABLET | Freq: Every day | ORAL | 2 refills | Status: DC

## 2016-07-11 MED ORDER — BECLOMETHASONE DIPROPIONATE 80 MCG/ACT IN AERS: 2.0000 | INHALATION_SPRAY | Freq: Two times a day (BID) | RESPIRATORY_TRACT | 5 refills | Status: DC

## 2016-07-11 NOTE — Patient Instructions (Signed)
GO TO THE LAB : please  provide a urine sample for a UDS     GO TO THE FRONT DESK Schedule labs to be done fasting within the next few days  Schedule your next appointment for a physical exam in one year   Call for refills as needed   Check the  blood pressure 2 or 3 times a month   Be sure your blood pressure is between 110/65 and  135/85. If it is consistently higher or lower, let me know

## 2016-07-11 NOTE — Assessment & Plan Note (Deleted)
Td 2013; pnm 23: 02-2015 ; Flu shot today  Sees gyn, Dr Epifania Gore, had a visit 2017t  Last  MMG 12-2015  Had iron deficiency anemia, Cscope 02-2012 neg  Diet and exercise discussed

## 2016-07-11 NOTE — Progress Notes (Signed)
Pre visit review using our clinic review tool, if applicable. No additional management support is needed unless otherwise documented below in the visit note. 

## 2016-07-11 NOTE — Progress Notes (Signed)
Subjective:    Patient ID: Caitlin Williamson, female    DOB: 11/24/1975, 41 y.o.   MRN: [id number]  DOS:  07/11/2016 Type of visit - description : cpx Interval history: No major concerns, good compliance w/ medication.   Review of Systems Constitutional: No fever. No chills. No unexplained wt changes. No unusual sweats  HEENT: No dental problems, no ear discharge, no facial swelling, no voice changes. No eye discharge, no eye  redness , no  intolerance to light   Respiratory: No wheezing , no  difficulty breathing. No cough , no mucus production  Cardiovascular: No CP, no leg swelling , no  Palpitations  GI: no nausea, no vomiting, no diarrhea , no  abdominal pain.  No blood in the stools. No dysphagia, no odynophagia    Endocrine: No polyphagia, no polyuria , no polydipsia  GU: No dysuria, gross hematuria, difficulty urinating. No urinary urgency, no frequency.  Musculoskeletal: No joint swellings or unusual aches or pains  Skin: No change in the color of the skin, palor , no  Rash  Allergic, immunologic: No environmental allergies , no  food allergies  Neurological: No dizziness no  syncope. No headaches. No diplopia, no slurred, no slurred speech, no motor deficits, no facial  Numbness  Hematological: No enlarged lymph nodes, no easy bruising , no unusual bleedings  Psychiatry: No suicidal ideas, no hallucinations, no beavior problems, no confusion.  No unusual/severe anxiety, no depression   Past Medical History:  Diagnosis Date  . Allergic rhinitis   . ANXIETY 03/11/2010   Qualifier: Diagnosis of  By: Larose Kells MD, [place]   . Cervical cancer ([place]) 2008  . GERD (gastroesophageal reflux disease) 07/30/2013  . Mild HTN     Past Surgical History:  Procedure Laterality Date  . BACK SURGERY  1999   L4-L5  . BREAST BIOPSY     right   . MASS EXCISION  05-2009   shoulder  right  . PARTIAL HYSTERECTOMY  2008   no oophorectomy  . ROTATOR CUFF REPAIR     right      Social History   Social History  . Marital status: Single    Spouse name: N/A  . Number of children: 1  . Years of education: N/A   Occupational History  . [employer],travels a lot    .  [place] History Main Topics  . Smoking status: Former Research scientist (life sciences)  . Smokeless tobacco: Never Used  . Alcohol use 10.8 oz/week    18 Cans of beer per week     Comment: socially  . Drug use: No  . Sexual activity: Not on file   Other Topics Concern  . Not on file   Social History Narrative   Lives by herself      Family History  Problem Relation Age of Onset  . Diabetes Mother   . Hypertension Mother   . Breast cancer Maternal Grandmother   . Stroke Maternal Grandmother   . Heart disease Maternal Grandmother   . Colon cancer Neg Hx      Allergies as of 07/11/2016      Reactions   Penicillins Anaphylaxis   Sulfonamide Derivatives Anaphylaxis      Medication List       Accurate as of 07/11/16  5:12 PM. Always use your most recent med list.          albuterol 108 (90 Base) MCG/ACT inhaler Commonly known  as:  VENTOLIN HFA Inhale 2 puffs into the lungs every 6 (six) hours as needed for wheezing.   ALPRAZolam 0.5 MG tablet Commonly known as:  XANAX Take 1 tablet (0.5 mg total) by mouth 2 (two) times daily as needed for anxiety or sleep.   beclomethasone 80 MCG/ACT inhaler Commonly known as:  QVAR Inhale 2 puffs into the lungs 2 (two) times daily.   fluticasone 50 MCG/ACT nasal spray Commonly known as:  FLONASE Place 2 sprays into both nostrils daily.   ibuprofen 200 MG tablet Commonly known as:  ADVIL,MOTRIN Take 400-600 mg by mouth every 6 (six) hours as needed. For pain   losartan 50 MG tablet Commonly known as:  COZAAR Take 1 tablet (50 mg total) by mouth daily.   montelukast 10 MG tablet Commonly known as:  SINGULAIR Take 1 tablet (10 mg total) by mouth at bedtime.   omeprazole 40 MG capsule Commonly known as:  PRILOSEC Take 1 capsule  (40 mg total) by mouth daily.          Objective:   Physical Exam BP 122/78 (BP Location: Left Arm, Patient Position: Sitting, Cuff Size: Small)   Pulse 68   Temp 97.8 F (36.6 C) (Oral)   Resp 14   Ht 5\' 1"  (1.549 m)   Wt 153 lb 8 oz (69.6 kg)   SpO2 97%   BMI 29.00 kg/m   General:   Well developed, well nourished . NAD.  Neck: No  thyromegaly  HEENT:  Normocephalic . Face symmetric, atraumatic Lungs:  CTA B Normal respiratory effort, no intercostal retractions, no accessory muscle use. Heart: RRR,  no murmur.  No pretibial edema bilaterally  Abdomen:  Not distended, soft, non-tender. No rebound or rigidity.   Skin: Exposed areas without rash. Not pale. Not jaundice Neurologic:  alert & oriented X3.  Speech normal, gait appropriate for age and unassisted Strength symmetric and appropriate for age.  Psych: Cognition and judgment appear intact.  Cooperative with normal attention span and concentration.  Behavior appropriate. No anxious or depressed appearing.    Assessment & Plan:   Assessment HTN Asthma Anxiety   PLAN: HTN: Continue losartan, no recent ambulatory BPs. Recommend to check BP from time to time. Asthma : Continue Qvar and Singulair. Uses albuterol sporadically. GERD: sx controlled as long as she takes PPI. Refill provided Anxiety. Controlled on Xanax as needed. UDS and contract today.  RTC one year

## 2016-07-11 NOTE — Assessment & Plan Note (Signed)
HTN: Continue losartan, no recent ambulatory BPs. Recommend to check BP from time to time. Asthma : Continue Qvar and Singulair. Uses albuterol sporadically. GERD: sx controlled as long as she takes PPI. Refill provided Anxiety. Controlled on Xanax as needed. UDS and contract today.  RTC one year

## 2016-07-11 NOTE — Assessment & Plan Note (Addendum)
Td 2013; pnm 23: 02-2015 ; Flu shot today  Sees gyn, Dr Mancel Bale, had a visit 2017 ; per pt last  MMG 12-2015  H/o iron deficiency anemia, Cscope 02-2012 neg  Diet and exercise discussed Labs: BMP, FLP, CBC, TSH

## 2016-07-13 ENCOUNTER — Other Ambulatory Visit (INDEPENDENT_AMBULATORY_CARE_PROVIDER_SITE_OTHER): Payer: Commercial Managed Care - PPO

## 2016-07-13 DIAGNOSIS — Z0000 Encounter for general adult medical examination without abnormal findings: Secondary | ICD-10-CM | POA: Diagnosis not present

## 2016-07-13 LAB — CBC WITH DIFFERENTIAL/PLATELET
BASOS ABS: 0 10*3/uL (ref 0.0–0.1)
BASOS PCT: 0.5 % (ref 0.0–3.0)
EOS ABS: 0.1 10*3/uL (ref 0.0–0.7)
Eosinophils Relative: 1.9 % (ref 0.0–5.0)
HCT: 35.9 % — ABNORMAL LOW (ref 36.0–46.0)
HEMOGLOBIN: 12.3 g/dL (ref 12.0–15.0)
LYMPHS PCT: 26.4 % (ref 12.0–46.0)
Lymphs Abs: 1.6 10*3/uL (ref 0.7–4.0)
MCHC: 34.3 g/dL (ref 30.0–36.0)
MCV: 90.9 fl (ref 78.0–100.0)
Monocytes Absolute: 0.4 10*3/uL (ref 0.1–1.0)
Monocytes Relative: 7.1 % (ref 3.0–12.0)
Neutro Abs: 4 10*3/uL (ref 1.4–7.7)
Neutrophils Relative %: 64.1 % (ref 43.0–77.0)
Platelets: 262 10*3/uL (ref 150.0–400.0)
RBC: 3.95 Mil/uL (ref 3.87–5.11)
RDW: 14.6 % (ref 11.5–15.5)
WBC: 6.2 10*3/uL (ref 4.0–10.5)

## 2016-07-13 LAB — LIPID PANEL
CHOL/HDL RATIO: 4
CHOLESTEROL: 175 mg/dL (ref 0–200)
HDL: 46.7 mg/dL (ref >39.00)
LDL CALC: 103 mg/dL — AB (ref 0–99)
NONHDL: 127.84
Triglycerides: 122 mg/dL (ref 0.0–149.0)
VLDL: 24.4 mg/dL (ref 0.0–40.0)

## 2016-07-13 LAB — BASIC METABOLIC PANEL
BUN: 15 mg/dL (ref 6–23)
CHLORIDE: 105 meq/L (ref 96–112)
CO2: 26 meq/L (ref 19–32)
CREATININE: 0.93 mg/dL (ref 0.40–1.20)
Calcium: 8.9 mg/dL (ref 8.4–10.5)
GFR: 70.02 mL/min (ref >60.00)
GLUCOSE: 97 mg/dL (ref 70–99)
POTASSIUM: 3.8 meq/L (ref 3.5–5.1)
Sodium: 138 mEq/L (ref 135–145)

## 2016-07-13 LAB — TSH: TSH: 3.49 u[IU]/mL (ref 0.35–4.50)

## 2016-07-19 ENCOUNTER — Telehealth: Payer: Self-pay

## 2016-07-19 NOTE — Telephone Encounter (Signed)
UDS: 07/13/2016  Negative for Alprazolam: PRN   Low risk per PCP 07/19/2016

## 2016-07-21 ENCOUNTER — Telehealth: Payer: Self-pay | Admitting: Internal Medicine

## 2016-07-21 MED ORDER — BENZONATATE 100 MG PO CAPS: 100.0000 mg | ORAL_CAPSULE | Freq: Three times a day (TID) | ORAL | 0 refills | Status: DC | PRN

## 2016-07-21 MED ORDER — AZITHROMYCIN 250 MG PO TABS: ORAL_TABLET | ORAL | 0 refills | Status: DC

## 2016-07-21 NOTE — Telephone Encounter (Signed)
Rxs sent. Pt informed.

## 2016-07-21 NOTE — Telephone Encounter (Signed)
Patient called stating that she has a cough and it feels like the same thing she had in June of 2017. She is flying into [place] from [place] and then leaving to go out of state again tomorrow. She would like to know if she could get some medication called in. She states it was an antibiotic and cough medicine that was called in in June of 2017. Please advise  Phone: [phone] (can leave a message, patient is boarding a plane soon)  [id number] [place], [place]

## 2016-07-21 NOTE — Telephone Encounter (Signed)
Okay, please call a Z-Pak and Tessalon Perles 100 mg 3 times a day when necessary #21 no refills. Let the patient know we are calling the medication. Office visit next week if not improving, ER if symptoms severe.

## 2016-07-21 NOTE — Telephone Encounter (Signed)
Please advise 

## 2016-09-04 ENCOUNTER — Other Ambulatory Visit: Payer: Self-pay

## 2016-09-04 MED ORDER — BECLOMETHASONE DIPROP HFA 80 MCG/ACT IN AERB: 2.0000 | INHALATION_SPRAY | Freq: Two times a day (BID) | RESPIRATORY_TRACT | 5 refills | Status: DC

## 2016-09-20 ENCOUNTER — Encounter: Payer: Self-pay | Admitting: Internal Medicine

## 2017-01-01 LAB — HM MAMMOGRAPHY

## 2017-01-01 LAB — HM PAP SMEAR

## 2017-01-11 ENCOUNTER — Encounter: Payer: Self-pay | Admitting: Internal Medicine

## 2017-02-22 ENCOUNTER — Other Ambulatory Visit: Payer: Self-pay | Admitting: Internal Medicine

## 2017-04-10 ENCOUNTER — Other Ambulatory Visit: Payer: Self-pay | Admitting: Internal Medicine

## 2017-04-12 ENCOUNTER — Other Ambulatory Visit: Payer: Self-pay | Admitting: Internal Medicine

## 2017-04-12 NOTE — Telephone Encounter (Signed)
Okay #60 and 2 refills 

## 2017-04-12 NOTE — Telephone Encounter (Signed)
Pt is requesting refill on alprazolam 0.5mg .   Last OV: 07/11/2016 Last Fill: 07/11/2016 #60 and 2RF UDS: 07/13/2016 Low risk  NCCR printed; no issues noted.  Please advise.

## 2017-04-12 NOTE — Telephone Encounter (Signed)
Rx printed, awaiting MD signature.  

## 2017-04-12 NOTE — Telephone Encounter (Signed)
[facility] to [facility].  

## 2017-06-13 ENCOUNTER — Other Ambulatory Visit: Payer: Self-pay | Admitting: Internal Medicine

## 2017-07-15 ENCOUNTER — Other Ambulatory Visit: Payer: Self-pay | Admitting: Internal Medicine

## 2017-08-03 ENCOUNTER — Encounter: Payer: Self-pay | Admitting: Internal Medicine

## 2017-08-03 ENCOUNTER — Ambulatory Visit (INDEPENDENT_AMBULATORY_CARE_PROVIDER_SITE_OTHER): Payer: Commercial Managed Care - PPO | Admitting: Internal Medicine

## 2017-08-03 VITALS — BP 124/64 | HR 68 | Temp 98.0°F | Resp 14 | Ht 61.0 in | Wt 158.0 lb

## 2017-08-03 DIAGNOSIS — Z0000 Encounter for general adult medical examination without abnormal findings: Secondary | ICD-10-CM | POA: Diagnosis not present

## 2017-08-03 DIAGNOSIS — Z79899 Other long term (current) drug therapy: Secondary | ICD-10-CM

## 2017-08-03 DIAGNOSIS — F411 Generalized anxiety disorder: Secondary | ICD-10-CM

## 2017-08-03 NOTE — Progress Notes (Signed)
Pre visit review using our clinic review tool, if applicable. No additional management support is needed unless otherwise documented below in the visit note. 

## 2017-08-03 NOTE — Patient Instructions (Signed)
  GO TO THE FRONT DESK Schedule fasting labs to be done next week  Schedule your next appointment for a sickle exam in 1 year.   Check the  blood pressure   monthly   Be sure your blood pressure is between 110/65 and  135/85. If it is consistently higher or lower, let me know  Think about Mediterranean diet after you finish the keto diet  Hold Singulair see how you do.  Maybe Beclometasone r is all you need.

## 2017-08-03 NOTE — Progress Notes (Signed)
Subjective:    Patient ID: Caitlin Williamson, female    DOB: Nov 10, 1975, 42 y.o.   MRN: [id number]  DOS:  08/03/2017 Type of visit - description : CPX Interval history: In general feeling well, still is very busy at work, has little time to exercise.   Review of Systems  A 14 point review of systems is negative    Past Medical History:  Diagnosis Date  . Allergic rhinitis   . ANXIETY 03/11/2010   Qualifier: Diagnosis of  By: Larose Kells MD, [place]   . Cervical cancer ([place]) 2008  . GERD (gastroesophageal reflux disease) 07/30/2013  . Mild HTN     Past Surgical History:  Procedure Laterality Date  . BACK SURGERY  1999   L4-L5  . BREAST BIOPSY     right   . MASS EXCISION  05-2009   shoulder  right  . PARTIAL HYSTERECTOMY  2008   no oophorectomy  . ROTATOR CUFF REPAIR     right    Social History   Socioeconomic History  . Marital status: Married    Spouse name: Not on file  . Number of children: 1  . Years of education: Not on file  . Highest education level: Not on file  Social Needs  . Financial resource strain: Not on file  . Food insecurity - worry: Not on file  . Food insecurity - inability: Not on file  . Transportation needs - medical: Not on file  . Transportation needs - non-medical: Not on file  Occupational History  . Occupation: [employer],travels a lot     Employer: [employer]  Tobacco Use  . Smoking status: Former Research scientist (life sciences)  . Smokeless tobacco: Never Used  Substance and Sexual Activity  . Alcohol use: Yes    Alcohol/week: 10.8 oz    Types: 18 Cans of beer per week    Comment: socially  . Drug use: No  . Sexual activity: Not on file  Other Topics Concern  . Not on file  Social History Narrative   Married , household pt and spouse      Family History  Problem Relation Age of Onset  . Diabetes Mother   . Hypertension Mother   . Breast cancer Maternal Grandmother   . Stroke Maternal Grandmother   . Heart disease Maternal  Grandmother   . Colon cancer Neg Hx      Allergies as of 08/03/2017      Reactions   Penicillins Anaphylaxis   Sulfonamide Derivatives Anaphylaxis      Medication List        Accurate as of 08/03/17 11:59 PM. Always use your most recent med list.          albuterol 108 (90 Base) MCG/ACT inhaler Commonly known as:  VENTOLIN HFA Inhale 2 puffs into the lungs every 6 (six) hours as needed for wheezing.   ALPRAZolam 0.5 MG tablet Commonly known as:  XANAX Take 1 tablet (0.5 mg total) by mouth 2 (two) times daily as needed for anxiety or sleep.   beclomethasone 80 MCG/ACT inhaler Commonly known as:  QVAR REDIHALER Inhale 2 puffs into the lungs 2 (two) times daily.   fluticasone 50 MCG/ACT nasal spray Commonly known as:  FLONASE Place 2 sprays into both nostrils daily.   ibuprofen 200 MG tablet Commonly known as:  ADVIL,MOTRIN Take 400-600 mg by mouth every 6 (six) hours as needed. For pain   losartan 50 MG tablet Commonly  known as:  COZAAR Take 1 tablet (50 mg total) by mouth daily.   montelukast 10 MG tablet Commonly known as:  SINGULAIR Take 1 tablet (10 mg total) by mouth at bedtime.   omeprazole 40 MG capsule Commonly known as:  PRILOSEC Take 1 capsule (40 mg total) by mouth daily.          Objective:   Physical Exam BP 124/64 (BP Location: Left Arm, Patient Position: Sitting, Cuff Size: Small)   Pulse 68   Temp 98 F (36.7 C) (Oral)   Resp 14   Ht 5\' 1"  (1.549 m)   Wt 158 lb (71.7 kg)   SpO2 97%   BMI 29.85 kg/m  General:   Well developed, well nourished . NAD.  Neck: No  thyromegaly  HEENT:  Normocephalic . Face symmetric, atraumatic Lungs:  CTA B Normal respiratory effort, no intercostal retractions, no accessory muscle use. Heart: RRR,  no murmur.  No pretibial edema bilaterally  Abdomen:  Not distended, soft, non-tender. No rebound or rigidity.   Skin: Exposed areas without rash. Not pale. Not jaundice Neurologic:  alert & oriented  X3.  Speech normal, gait appropriate for age and unassisted Strength symmetric and appropriate for age.  Psych: Cognition and judgment appear intact.  Cooperative with normal attention span and concentration.  Behavior appropriate. No anxious or depressed appearing.     Assessment & Plan:   Assessment HTN GERD Asthma Anxiety    PLAN: HTN: Well-controlled on losartan, checking labs GERD: On PPIs, no symptoms Asthma: On Singulair, Qvar qd;  hardly ever use her rescue inhaler.  Rec to hold Singulair and see how she does. Anxiety: Takes Xanax rarely, mostly at night.  Checking a UDS; contract signed today. RTC 1 year

## 2017-08-03 NOTE — Assessment & Plan Note (Addendum)
-  Td 2013; pnm 23: 02-2015 ; had a Flu shot   -Sees gyn, Dr Mancel Bale, [facility] 01-2017  -H/o iron deficiency anemia, Cscope 02-2012 neg  -Diet and exercise: Discussed, is doing a keto diet for the last 2 weeks, recommend to switch to a different diet in few weeks such as the [place] which is likely more sustainable.  Encourage to increase physical activity -Labs: Will RTC fasting: CMP, FLP, CBC, TSH, UDS -Advance directives information provided

## 2017-08-04 NOTE — Assessment & Plan Note (Signed)
HTN: Well-controlled on losartan, checking labs GERD: On PPIs, no symptoms Asthma: On Singulair, Qvar qd;  hardly ever use her rescue inhaler.  Rec to hold Singulair and see how she does. Anxiety: Takes Xanax rarely, mostly at night.  Checking a UDS; contract signed today. RTC 1 year

## 2017-08-06 ENCOUNTER — Other Ambulatory Visit (INDEPENDENT_AMBULATORY_CARE_PROVIDER_SITE_OTHER): Payer: Commercial Managed Care - PPO

## 2017-08-06 DIAGNOSIS — Z79899 Other long term (current) drug therapy: Secondary | ICD-10-CM

## 2017-08-06 DIAGNOSIS — Z0000 Encounter for general adult medical examination without abnormal findings: Secondary | ICD-10-CM | POA: Diagnosis not present

## 2017-08-06 DIAGNOSIS — F411 Generalized anxiety disorder: Secondary | ICD-10-CM

## 2017-08-06 LAB — COMPREHENSIVE METABOLIC PANEL
ALT: 18 U/L (ref 0–35)
AST: 20 U/L (ref 0–37)
Albumin: 4.2 g/dL (ref 3.5–5.2)
Alkaline Phosphatase: 62 U/L (ref 39–117)
BUN: 20 mg/dL (ref 6–23)
CALCIUM: 9.2 mg/dL (ref 8.4–10.5)
CO2: 26 meq/L (ref 19–32)
CREATININE: 0.88 mg/dL (ref 0.40–1.20)
Chloride: 105 mEq/L (ref 96–112)
GFR: 74.26 mL/min (ref >60.00)
Glucose, Bld: 105 mg/dL — ABNORMAL HIGH (ref 70–99)
Potassium: 4 mEq/L (ref 3.5–5.1)
Sodium: 139 mEq/L (ref 135–145)
Total Bilirubin: 0.5 mg/dL (ref 0.2–1.2)
Total Protein: 7.3 g/dL (ref 6.0–8.3)

## 2017-08-06 LAB — CBC WITH DIFFERENTIAL/PLATELET
BASOS PCT: 0.6 % (ref 0.0–3.0)
Basophils Absolute: 0 10*3/uL (ref 0.0–0.1)
EOS ABS: 0.1 10*3/uL (ref 0.0–0.7)
EOS PCT: 0.9 % (ref 0.0–5.0)
HEMATOCRIT: 36.1 % (ref 36.0–46.0)
HEMOGLOBIN: 11.8 g/dL — AB (ref 12.0–15.0)
Lymphocytes Relative: 27.9 % (ref 12.0–46.0)
Lymphs Abs: 1.8 10*3/uL (ref 0.7–4.0)
MCHC: 32.7 g/dL (ref 30.0–36.0)
MCV: 89.5 fl (ref 78.0–100.0)
Monocytes Absolute: 0.4 10*3/uL (ref 0.1–1.0)
Monocytes Relative: 6.2 % (ref 3.0–12.0)
Neutro Abs: 4.1 10*3/uL (ref 1.4–7.7)
Neutrophils Relative %: 64.4 % (ref 43.0–77.0)
Platelets: 332 10*3/uL (ref 150.0–400.0)
RBC: 4.03 Mil/uL (ref 3.87–5.11)
RDW: 14.5 % (ref 11.5–15.5)
WBC: 6.4 10*3/uL (ref 4.0–10.5)

## 2017-08-06 LAB — LIPID PANEL
Cholesterol: 166 mg/dL (ref 0–200)
HDL: 38.4 mg/dL — ABNORMAL LOW (ref >39.00)
LDL CALC: 100 mg/dL — AB (ref 0–99)
NONHDL: 127.96
TRIGLYCERIDES: 138 mg/dL (ref 0.0–149.0)
Total CHOL/HDL Ratio: 4
VLDL: 27.6 mg/dL (ref 0.0–40.0)

## 2017-08-06 LAB — TSH: TSH: 3.72 u[IU]/mL (ref 0.35–4.50)

## 2017-08-07 ENCOUNTER — Other Ambulatory Visit (INDEPENDENT_AMBULATORY_CARE_PROVIDER_SITE_OTHER): Payer: Commercial Managed Care - PPO

## 2017-08-07 DIAGNOSIS — D649 Anemia, unspecified: Secondary | ICD-10-CM

## 2017-08-07 LAB — IRON: IRON: 69 ug/dL (ref 42–145)

## 2017-08-08 LAB — FERRITIN: FERRITIN: 8.6 ng/mL — AB (ref 10.0–291.0)

## 2017-08-09 LAB — PAIN MGMT, PROFILE 8 W/CONF, U
6 Acetylmorphine: NEGATIVE ng/mL (ref <10)
ALPHAHYDROXYMIDAZOLAM: NEGATIVE ng/mL (ref <50)
ALPHAHYDROXYTRIAZOLAM: NEGATIVE ng/mL (ref <50)
AMINOCLONAZEPAM: NEGATIVE ng/mL (ref <25)
Alcohol Metabolites: POSITIVE ng/mL — AB (ref <500)
Alphahydroxyalprazolam: NEGATIVE ng/mL (ref <25)
Amphetamines: NEGATIVE ng/mL (ref <500)
Benzodiazepines: NEGATIVE ng/mL (ref <100)
Buprenorphine, Urine: NEGATIVE ng/mL (ref <5)
Cocaine Metabolite: NEGATIVE ng/mL (ref <150)
Creatinine: 107.7 mg/dL
ETHYL SULFATE (ETS): 3320 ng/mL — AB (ref <100)
Ethyl Glucuronide (ETG): 11406 ng/mL — ABNORMAL HIGH (ref <500)
Hydroxyethylflurazepam: NEGATIVE ng/mL (ref <50)
Lorazepam: NEGATIVE ng/mL (ref <50)
MDMA: NEGATIVE ng/mL (ref <500)
Marijuana Metabolite: NEGATIVE ng/mL (ref <20)
Nordiazepam: NEGATIVE ng/mL (ref <50)
OXAZEPAM: NEGATIVE ng/mL (ref <50)
OXYCODONE: NEGATIVE ng/mL (ref <100)
Opiates: NEGATIVE ng/mL (ref <100)
Oxidant: NEGATIVE ug/mL (ref <200)
TEMAZEPAM: NEGATIVE ng/mL (ref <50)
pH: 6.91 (ref 4.5–9.0)

## 2017-09-13 ENCOUNTER — Other Ambulatory Visit: Payer: Self-pay | Admitting: Internal Medicine

## 2017-09-13 MED ORDER — BECLOMETHASONE DIPROP HFA 80 MCG/ACT IN AERB: 2.0000 | INHALATION_SPRAY | Freq: Two times a day (BID) | RESPIRATORY_TRACT | 5 refills | Status: DC

## 2017-10-07 ENCOUNTER — Encounter (HOSPITAL_BASED_OUTPATIENT_CLINIC_OR_DEPARTMENT_OTHER): Payer: Self-pay | Admitting: *Deleted

## 2017-10-07 ENCOUNTER — Emergency Department (HOSPITAL_BASED_OUTPATIENT_CLINIC_OR_DEPARTMENT_OTHER)
Admission: EM | Admit: 2017-10-07 | Discharge: 2017-10-07 | Disposition: A | Discharge status: Home / Self Care | Payer: Commercial Managed Care - PPO | Attending: Emergency Medicine | Admitting: Emergency Medicine

## 2017-10-07 ENCOUNTER — Other Ambulatory Visit: Payer: Self-pay

## 2017-10-07 DIAGNOSIS — Z5321 Procedure and treatment not carried out due to patient leaving prior to being seen by health care provider: Secondary | ICD-10-CM | POA: Diagnosis not present

## 2017-10-07 DIAGNOSIS — M79632 Pain in left forearm: Secondary | ICD-10-CM | POA: Diagnosis present

## 2017-10-07 NOTE — ED Triage Notes (Signed)
Pt was bitten on the face-under right eye and on bridge of nose and also on left forearm tonight by her dog.  Dog is UTD on shots.

## 2017-10-07 NOTE — ED Notes (Signed)
Pt told registration that she was leaving and was going to see her PCP in the morning.

## 2017-10-08 ENCOUNTER — Encounter: Payer: Self-pay | Admitting: Internal Medicine

## 2017-10-08 ENCOUNTER — Ambulatory Visit: Payer: Commercial Managed Care - PPO | Admitting: Internal Medicine

## 2017-10-08 VITALS — BP 128/78 | HR 57 | Temp 97.9°F | Resp 14 | Ht 61.0 in | Wt 150.4 lb

## 2017-10-08 DIAGNOSIS — S0185XA Open bite of other part of head, initial encounter: Secondary | ICD-10-CM

## 2017-10-08 DIAGNOSIS — W540XXA Bitten by dog, initial encounter: Secondary | ICD-10-CM | POA: Diagnosis not present

## 2017-10-08 MED ORDER — DOXYCYCLINE HYCLATE 100 MG PO TABS: 100.0000 mg | ORAL_TABLET | Freq: Two times a day (BID) | ORAL | 0 refills | Status: DC

## 2017-10-08 NOTE — Progress Notes (Signed)
Pre visit review using our clinic review tool, if applicable. No additional management support is needed unless otherwise documented below in the visit note. 

## 2017-10-08 NOTE — Progress Notes (Signed)
Subjective:    Patient ID: [name], female    DOB: [birth year], 42 y.o.   MRN: [id number]  DOS:  10/08/2017 Type of visit - description : acute Interval history: Yesterday, patient was playing with her dog and he bite her on the face.  Went to the ER, left without being seen. The area is a slightly more sore today, redness has not sbread, denies any discharge. No fever or chills. Her dog is not sick, he is up-to-date on vaccinations.    Review of Systems   Past Medical History:  Diagnosis Date  . Allergic rhinitis   . ANXIETY 03/11/2010   Qualifier: Diagnosis of  By: Larose Kells MD, [place]   . Cervical cancer ([place]) 2008  . GERD (gastroesophageal reflux disease) 07/30/2013  . Mild HTN     Past Surgical History:  Procedure Laterality Date  . BACK SURGERY  1999   L4-L5  . BREAST BIOPSY     right   . MASS EXCISION  05-2009   shoulder  right  . PARTIAL HYSTERECTOMY  2008   no oophorectomy  . ROTATOR CUFF REPAIR     right    Social History   Socioeconomic History  . Marital status: Married    Spouse name: Not on file  . Number of children: 1  . Years of education: Not on file  . Highest education level: Not on file  Occupational History  . 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 Financial resource strain: Not on file  . Food insecurity:    Worry: Not on file    Inability: Not on file  . Transportation needs:    Medical: Not on file    Non-medical: Not on file  Tobacco Use  . Smoking status: Former Research scientist (life sciences)  . Smokeless tobacco: Never Used  Substance and Sexual Activity  . Alcohol use: Yes    Alcohol/week: 10.8 oz    Types: 18 Cans of beer per week    Comment: socially  . Drug use: No  . Sexual activity: Not on file  Lifestyle  . Physical activity:    Days per week: Not on file    Minutes per session: Not on file  . Stress: Not on file  Relationships  . Social connections:    Talks on phone: Not  on file    Gets together: Not on file    Attends religious service: Not on file    Active member of club or organization: Not on file    Attends meetings of clubs or organizations: Not on file    Relationship status: Not on file  . Intimate partner violence:    Fear of current or ex partner: Not on file    Emotionally abused: Not on file    Physically abused: Not on file    Forced sexual activity: Not on file  Other Topics Concern  . Not on file  Social History Narrative   Married , household pt and spouse       Allergies as of 10/08/2017      Reactions   Penicillins Anaphylaxis   Sulfonamide Derivatives Anaphylaxis      Medication List        Accurate as of 10/08/17 11:59 PM. Always use your most recent med list.          albuterol 108 (90 Base) MCG/ACT inhaler Commonly known as:  PROVENTIL  HFA;VENTOLIN HFA Inhale 2 puffs into the lungs every 6 (six) hours as needed for wheezing or shortness of breath.   ALPRAZolam 0.5 MG tablet Commonly known as:  XANAX Take 1 tablet (0.5 mg total) by mouth 2 (two) times daily as needed for anxiety or sleep.   beclomethasone 80 MCG/ACT inhaler Commonly known as:  QVAR REDIHALER Inhale 2 puffs into the lungs 2 (two) times daily.   doxycycline 100 MG tablet Commonly known as:  VIBRA-TABS Take 1 tablet (100 mg total) by mouth 2 (two) times daily.   fluticasone 50 MCG/ACT nasal spray Commonly known as:  FLONASE Place 2 sprays into both nostrils daily.   ibuprofen 200 MG tablet Commonly known as:  ADVIL,MOTRIN Take 400-600 mg by mouth every 6 (six) hours as needed. For pain   losartan 50 MG tablet Commonly known as:  COZAAR Take 1 tablet (50 mg total) by mouth daily.   montelukast 10 MG tablet Commonly known as:  SINGULAIR Take 1 tablet (10 mg total) by mouth at bedtime.   omeprazole 40 MG capsule Commonly known as:  PRILOSEC Take 1 capsule (40 mg total) by mouth daily.          Objective:   Physical Exam BP 128/78  (BP Location: Left Arm, Patient Position: Sitting, Cuff Size: Small)   Pulse (!) 57   Temp 97.9 F (36.6 C) (Oral)   Resp 14   Ht 5\' 1"  (1.549 m)   Wt 150 lb 6 oz (68.2 kg)   SpO2 97%   BMI 28.41 kg/m  General:   Well developed, well nourished . NAD.  HEENT:  Normocephalic . Face symmetric, atraumatic.  EOMI See pictures Neurologic:  alert & oriented X3.  Speech normal, gait appropriate for age and unassisted Psych--  Cognition and judgment appear intact.  Cooperative with normal attention span and concentration.  Behavior appropriate. No anxious or depressed appearing.          Assessment & Plan:    Assessment HTN GERD Asthma Anxiety    PLAN: Dog bite: Offender was her own dog, dog is vaccinated and looks healthy. Plan: Local care with antibiotic ointments, keep it clean and dry.  Will start prophylaxis with doxycycline.  She is UTD on tetanus shot. Watch her dog closely for any  illnesses and report them immediately.

## 2017-10-08 NOTE — Patient Instructions (Addendum)
Take the antibiotics as prescribed  Call if not gradually improving  Call if the redness, swelling gets much worse  No excessive sun exposure while on doxycycline  Okay to use a antibiotic ointment OTC

## 2017-10-09 NOTE — Assessment & Plan Note (Signed)
PLAN: Dog bite: Offender was her own dog, dog is vaccinated and looks healthy. Plan: Local care with antibiotic ointments, keep it clean and dry.  Will start prophylaxis with doxycycline.  She is UTD on tetanus shot. Watch her dog closely for any  illnesses and report them immediately.

## 2017-12-12 ENCOUNTER — Other Ambulatory Visit: Payer: Self-pay | Admitting: Internal Medicine

## 2018-01-02 LAB — HM PAP SMEAR

## 2018-01-02 LAB — HM MAMMOGRAPHY

## 2018-01-08 ENCOUNTER — Encounter: Payer: Self-pay | Admitting: Internal Medicine

## 2018-01-20 ENCOUNTER — Other Ambulatory Visit: Payer: Self-pay | Admitting: Internal Medicine

## 2018-08-05 ENCOUNTER — Encounter: Payer: Commercial Managed Care - PPO | Admitting: Internal Medicine

## 2018-08-23 ENCOUNTER — Ambulatory Visit (INDEPENDENT_AMBULATORY_CARE_PROVIDER_SITE_OTHER): Payer: Commercial Managed Care - PPO | Admitting: Internal Medicine

## 2018-08-23 ENCOUNTER — Encounter: Payer: Self-pay | Admitting: Internal Medicine

## 2018-08-23 VITALS — BP 126/66 | HR 55 | Temp 97.8°F | Resp 16 | Ht 61.0 in | Wt 160.2 lb

## 2018-08-23 DIAGNOSIS — F411 Generalized anxiety disorder: Secondary | ICD-10-CM | POA: Diagnosis not present

## 2018-08-23 DIAGNOSIS — Z79899 Other long term (current) drug therapy: Secondary | ICD-10-CM | POA: Diagnosis not present

## 2018-08-23 DIAGNOSIS — Z0000 Encounter for general adult medical examination without abnormal findings: Secondary | ICD-10-CM

## 2018-08-23 DIAGNOSIS — R739 Hyperglycemia, unspecified: Secondary | ICD-10-CM | POA: Diagnosis not present

## 2018-08-23 LAB — CBC WITH DIFFERENTIAL/PLATELET
Basophils Absolute: 0 10*3/uL (ref 0.0–0.1)
Basophils Relative: 0.4 % (ref 0.0–3.0)
Eosinophils Absolute: 0.1 10*3/uL (ref 0.0–0.7)
Eosinophils Relative: 1.1 % (ref 0.0–5.0)
HCT: 39.6 % (ref 36.0–46.0)
Hemoglobin: 13.3 g/dL (ref 12.0–15.0)
Lymphocytes Relative: 34.6 % (ref 12.0–46.0)
Lymphs Abs: 2.4 10*3/uL (ref 0.7–4.0)
MCHC: 33.5 g/dL (ref 30.0–36.0)
MCV: 94.3 fl (ref 78.0–100.0)
Monocytes Absolute: 0.5 10*3/uL (ref 0.1–1.0)
Monocytes Relative: 6.8 % (ref 3.0–12.0)
Neutro Abs: 3.9 10*3/uL (ref 1.4–7.7)
Neutrophils Relative %: 57.1 % (ref 43.0–77.0)
Platelets: 287 10*3/uL (ref 150.0–400.0)
RBC: 4.2 Mil/uL (ref 3.87–5.11)
RDW: 13.3 % (ref 11.5–15.5)
WBC: 6.9 10*3/uL (ref 4.0–10.5)

## 2018-08-23 LAB — LIPID PANEL
CHOL/HDL RATIO: 4
Cholesterol: 196 mg/dL (ref 0–200)
HDL: 48.1 mg/dL (ref >39.00)
LDL Cholesterol: 129 mg/dL — ABNORMAL HIGH (ref 0–99)
NONHDL: 147.81
TRIGLYCERIDES: 92 mg/dL (ref 0.0–149.0)
VLDL: 18.4 mg/dL (ref 0.0–40.0)

## 2018-08-23 LAB — COMPREHENSIVE METABOLIC PANEL
ALT: 19 U/L (ref 0–35)
AST: 20 U/L (ref 0–37)
Albumin: 4.4 g/dL (ref 3.5–5.2)
Alkaline Phosphatase: 59 U/L (ref 39–117)
BILIRUBIN TOTAL: 0.7 mg/dL (ref 0.2–1.2)
BUN: 15 mg/dL (ref 6–23)
CALCIUM: 9.5 mg/dL (ref 8.4–10.5)
CO2: 26 meq/L (ref 19–32)
CREATININE: 0.86 mg/dL (ref 0.40–1.20)
Chloride: 103 mEq/L (ref 96–112)
GFR: 71.4 mL/min (ref >60.00)
GLUCOSE: 90 mg/dL (ref 70–99)
Potassium: 4.6 mEq/L (ref 3.5–5.1)
SODIUM: 138 meq/L (ref 135–145)
Total Protein: 6.9 g/dL (ref 6.0–8.3)

## 2018-08-23 LAB — TSH: TSH: 1.96 u[IU]/mL (ref 0.35–4.50)

## 2018-08-23 LAB — HEMOGLOBIN A1C: Hgb A1c MFr Bld: 5.5 % (ref 4.6–6.5)

## 2018-08-23 LAB — B12 AND FOLATE PANEL
Folate: >24 ng/mL (ref >5.9)
Vitamin B-12: 556 pg/mL (ref 211–911)

## 2018-08-23 MED ORDER — ALPRAZOLAM 0.5 MG PO TABS: 0.5000 mg | ORAL_TABLET | Freq: Two times a day (BID) | ORAL | 2 refills | Status: DC | PRN

## 2018-08-23 NOTE — Progress Notes (Signed)
Subjective:    Patient ID: Caitlin Williamson, female    DOB: Mar 31, 1976, 43 y.o.   MRN: [id number]  DOS:  08/23/2018 Type of visit - description: CPX Has few concerns   Review of Systems For a while has noted that the right heel tingles sometimes, at the bottom, no necessarily worse with walking, sometimes at rest.  No injury or swelling.  Other than above, a 14 point review of systems is negative     Past Medical History:  Diagnosis Date  . Allergic rhinitis   . ANXIETY 03/11/2010   Qualifier: Diagnosis of  By: Larose Kells MD, [place]   . Cervical cancer ([place]) 2008  . GERD (gastroesophageal reflux disease) 07/30/2013  . Mild HTN     Past Surgical History:  Procedure Laterality Date  . BACK SURGERY  1999   L4-L5  . BREAST BIOPSY     right   . MASS EXCISION  05-2009   shoulder  right  . PARTIAL HYSTERECTOMY  2008   no oophorectomy  . ROTATOR CUFF REPAIR     right    Social History   Socioeconomic History  . Marital status: Married    Spouse name: Not on file  . Number of children: 1  . Years of education: Not on file  . Highest education level: Not on file  Occupational History  . 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 Financial resource strain: Not on file  . Food insecurity:    Worry: Not on file    Inability: Not on file  . Transportation needs:    Medical: Not on file    Non-medical: Not on file  Tobacco Use  . Smoking status: Former Research scientist (life sciences)  . Smokeless tobacco: Never Used  Substance and Sexual Activity  . Alcohol use: Yes    Alcohol/week: 18.0 standard drinks    Types: 18 Cans of beer per week    Comment: socially  . Drug use: No  . Sexual activity: Not on file  Lifestyle  . Physical activity:    Days per week: Not on file    Minutes per session: Not on file  . Stress: Not on file  Relationships  . Social connections:    Talks on phone: Not on file    Gets together: Not on file    Attends  religious service: Not on file    Active member of club or organization: Not on file    Attends meetings of clubs or organizations: Not on file    Relationship status: Not on file  . Intimate partner violence:    Fear of current or ex partner: Not on file    Emotionally abused: Not on file    Physically abused: Not on file    Forced sexual activity: Not on file  Other Topics Concern  . Not on file  Social History Narrative   Married , household pt and spouse    Son ~ 1995     Family History  Problem Relation Age of Onset  . Diabetes Mother   . Hypertension Mother   . Heart failure Mother   . Breast cancer Maternal Grandmother   . Stroke Maternal Grandmother   . Heart disease Maternal Grandmother   . Colon cancer Neg Hx      Allergies as of 08/23/2018      Reactions   Penicillins Anaphylaxis   Sulfonamide Derivatives  Anaphylaxis      Medication List       Accurate as of August 23, 2018 11:59 PM. Always use your most recent med list.        albuterol 108 (90 Base) MCG/ACT inhaler Commonly known as:  PROVENTIL HFA;VENTOLIN HFA Inhale 2 puffs into the lungs every 6 (six) hours as needed for wheezing or shortness of breath.   ALPRAZolam 0.5 MG tablet Commonly known as:  XANAX Take 1 tablet (0.5 mg total) by mouth 2 (two) times daily as needed for anxiety or sleep.   beclomethasone 80 MCG/ACT inhaler Commonly known as:  QVAR REDIHALER Inhale 2 puffs into the lungs 2 (two) times daily.   fluticasone 50 MCG/ACT nasal spray Commonly known as:  FLONASE Place 2 sprays into both nostrils daily.   ibuprofen 200 MG tablet Commonly known as:  ADVIL,MOTRIN Take 400-600 mg by mouth every 6 (six) hours as needed. For pain   losartan 50 MG tablet Commonly known as:  COZAAR TAKE 1 TABLET BY MOUTH EVERY DAY   montelukast 10 MG tablet Commonly known as:  SINGULAIR Take 1 tablet (10 mg total) by mouth at bedtime.   omeprazole 40 MG capsule Commonly known as:   PRILOSEC Take 1 capsule (40 mg total) by mouth daily.           Objective:   Physical Exam BP 126/66 (BP Location: Left Arm, Patient Position: Sitting, Cuff Size: Small)   Pulse (!) 55   Temp 97.8 F (36.6 C) (Oral)   Resp 16   Ht 5\' 1"  (1.549 m)   Wt 160 lb 4 oz (72.7 kg)   SpO2 98%   BMI 30.28 kg/m  General: Well developed, NAD, BMI noted Neck: No  thyromegaly  HEENT:  Normocephalic . Face symmetric, atraumatic Lungs:  CTA B Normal respiratory effort, no intercostal retractions, no accessory muscle use. Heart: RRR,  no murmur.  No pretibial edema bilaterally  Abdomen:  Not distended, soft, non-tender. No rebound or rigidity.   Skin: Exposed areas without rash. Not pale. Not jaundice lower extremities: No edema, good pedal pulses, pinprick examination of the feet normal. Neurologic:  alert & oriented X3.  Speech normal, gait appropriate for age and unassisted Strength symmetric and appropriate for age. DTRs: Right ankle slightly less briskly than the left?  Otherwise symmetric Psych: Cognition and judgment appear intact.  Cooperative with normal attention span and concentration.  Behavior appropriate. No anxious or depressed appearing.     Assessment     Assessment HTN GERD Asthma Anxiety, insomnia   PLAN: HTN: Good compliance w/ medications, no recent amb  BPs, continue losartan. Asthma: Well-controlled, on Qvar, rarely uses albuterol Anxiety, insomnia: Last year, UDS was not covered by insurance thus VERY expensive, will waive it for this year, get a contract, refill Xanax as needed. Numbness, heel: Unclear etiology, history of back surgery years ago, related to that? S 1 neuropathy?  Entrapment neuropathy?  We will check labs including X3K, G40 and folic acid.  Otherwise observe RTC 1 year

## 2018-08-23 NOTE — Patient Instructions (Signed)
GO TO THE LAB : Get the blood work     GO TO THE FRONT DESK Schedule your next appointment   physical exam in 1 year.

## 2018-08-23 NOTE — Assessment & Plan Note (Signed)
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 -Labs: CMP, FLP, CBC, A1c, TSH,

## 2018-08-23 NOTE — Progress Notes (Signed)
Pre visit review using our clinic review tool, if applicable. No additional management support is needed unless otherwise documented below in the visit note. 

## 2018-08-24 NOTE — Assessment & Plan Note (Signed)
HTN: Good compliance w/ medications, no recent amb  BPs, continue losartan. Asthma: Well-controlled, on Qvar, rarely uses albuterol Anxiety, insomnia: Last year, UDS was not covered by insurance thus VERY expensive, will waive it for this year, get a contract, refill Xanax as needed. Numbness, heel: Unclear etiology, history of back surgery years ago, related to that? S 1 neuropathy?  Entrapment neuropathy?  We will check labs including B3U, Y37 and folic acid.  Otherwise observe RTC 1 year

## 2018-10-12 ENCOUNTER — Other Ambulatory Visit: Payer: Self-pay | Admitting: Internal Medicine

## 2018-10-14 NOTE — Telephone Encounter (Signed)
Okay losartan 25 mg: two tablets once a day

## 2018-10-14 NOTE — Telephone Encounter (Signed)
Pt on losartan 50mg  daily. On back order. Pharmacy requesting losartan 25mg  bid. Okay to change?

## 2018-10-14 NOTE — Telephone Encounter (Signed)
Rx sent. MyChart message sent to Pt informing of change.

## 2018-11-19 ENCOUNTER — Other Ambulatory Visit: Payer: Self-pay | Admitting: Internal Medicine

## 2018-11-19 ENCOUNTER — Encounter: Payer: Self-pay | Admitting: Internal Medicine

## 2018-11-19 MED ORDER — FLUTICASONE PROPIONATE 50 MCG/ACT NA SUSP: 2.0000 | Freq: Every day | NASAL | 5 refills | Status: DC

## 2018-11-19 MED ORDER — BECLOMETHASONE DIPROP HFA 80 MCG/ACT IN AERB: 2.0000 | INHALATION_SPRAY | Freq: Two times a day (BID) | RESPIRATORY_TRACT | 5 refills | Status: DC

## 2018-11-19 MED ORDER — MONTELUKAST SODIUM 10 MG PO TABS: 10.0000 mg | ORAL_TABLET | Freq: Every day | ORAL | 3 refills | Status: DC

## 2019-01-06 ENCOUNTER — Other Ambulatory Visit: Payer: Self-pay | Admitting: Internal Medicine

## 2019-01-31 LAB — HM MAMMOGRAPHY

## 2019-02-03 ENCOUNTER — Other Ambulatory Visit: Payer: Self-pay

## 2019-02-05 ENCOUNTER — Encounter: Payer: Self-pay | Admitting: Internal Medicine

## 2019-02-05 ENCOUNTER — Other Ambulatory Visit: Payer: Self-pay

## 2019-02-05 ENCOUNTER — Ambulatory Visit: Payer: Commercial Managed Care - PPO | Admitting: Internal Medicine

## 2019-02-05 VITALS — BP 149/99 | HR 78 | Temp 98.6°F | Resp 16 | Ht 61.0 in | Wt 160.1 lb

## 2019-02-05 DIAGNOSIS — M79669 Pain in unspecified lower leg: Secondary | ICD-10-CM

## 2019-02-05 DIAGNOSIS — R600 Localized edema: Secondary | ICD-10-CM | POA: Diagnosis not present

## 2019-02-05 NOTE — Progress Notes (Signed)
Subjective:    Patient ID: Caitlin Williamson, female    DOB: Mar 30, 1976, 43 y.o.   MRN: [id number]  DOS:  02/05/2019 Type of visit - description: Acute visit 2 months ago, she was playing on the yard and she suddenly started to run,  immediately developed pain and swelling at the left calf. That is gradually better over time, minimal pain and occasional swelling persists. No knee injury or fall per se  Also, is noting bilateral lower extremity edema, slightly worse on the left.  Mostly is a "dent" at the sock area.  Wt Readings from Last 3 Encounters:  02/05/19 160 lb 2 oz (72.6 kg)  08/23/18 160 lb 4 oz (72.7 kg)  10/08/17 150 lb 6 oz (68.2 kg)     Review of Systems  Denies chest pain no difficulty breathing.  No palpitations Mild DOE, at baseline, going on for a while, patient thinks is related to being out of shape. No recent prolonged airplane trip or car trip.  Past Medical History:  Diagnosis Date  . Allergic rhinitis   . ANXIETY 03/11/2010   Qualifier: Diagnosis of  By: Larose Kells MD, [place]   . Cervical cancer ([employer]) 2008  . GERD (gastroesophageal reflux disease) 07/30/2013  . Mild HTN     Past Surgical History:  Procedure Laterality Date  . BACK SURGERY  1999   L4-L5  . BREAST BIOPSY     right   . MASS EXCISION  05-2009   shoulder  right  . PARTIAL HYSTERECTOMY  2008   no oophorectomy  . ROTATOR CUFF REPAIR     right    Social History   Socioeconomic History  . Marital status: Married    Spouse name: Not on file  . Number of children: 1  . Years of education: Not on file  . Highest education level: Not on file  Occupational History  . 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 Financial resource strain: Not on file  . Food insecurity    Worry: Not on file    Inability: Not on file  . Transportation needs    Medical: Not on file    Non-medical: Not on file  Tobacco Use  . Smoking status: Former  Research scientist (life sciences)  . Smokeless tobacco: Never Used  Substance and Sexual Activity  . Alcohol use: Yes    Alcohol/week: 18.0 standard drinks    Types: 18 Cans of beer per week    Comment: socially  . Drug use: No  . Sexual activity: Not on file  Lifestyle  . Physical activity    Days per week: Not on file    Minutes per session: Not on file  . Stress: Not on file  Relationships  . Social Herbalist on phone: Not on file    Gets together: Not on file    Attends religious service: Not on file    Active member of club or organization: Not on file    Attends meetings of clubs or organizations: Not on file    Relationship status: Not on file  . Intimate partner violence    Fear of current or ex partner: Not on file    Emotionally abused: Not on file    Physically abused: Not on file    Forced sexual activity: Not on file  Other Topics Concern  . Not on file  Social History Narrative  Married , household pt and spouse    Son ~ [place] as of 02/05/2019      Reactions   Penicillins Anaphylaxis   Sulfonamide Derivatives Anaphylaxis      Medication List       Accurate as of February 05, 2019 10:49 AM. If you have any questions, ask your nurse or doctor.        albuterol 108 (90 Base) MCG/ACT inhaler Commonly known as: VENTOLIN HFA Inhale 2 puffs into the lungs every 6 (six) hours as needed for wheezing or shortness of breath.   ALPRAZolam 0.5 MG tablet Commonly known as: XANAX Take 1 tablet (0.5 mg total) by mouth 2 (two) times daily as needed for anxiety or sleep.   beclomethasone 80 MCG/ACT inhaler Commonly known as: Qvar RediHaler Inhale 2 puffs into the lungs 2 (two) times daily.   fluticasone 50 MCG/ACT nasal spray Commonly known as: FLONASE Place 2 sprays into both nostrils daily.   ibuprofen 200 MG tablet Commonly known as: ADVIL Take 400-600 mg by mouth every 6 (six) hours as needed. For pain   losartan 25 MG tablet Commonly known as: COZAAR  Take 2 tablets (50 mg total) by mouth daily.   montelukast 10 MG tablet Commonly known as: SINGULAIR Take 1 tablet (10 mg total) by mouth at bedtime.   omeprazole 40 MG capsule Commonly known as: PRILOSEC Take 1 capsule (40 mg total) by mouth daily.           Objective:   Physical Exam BP (!) 149/99 (BP Location: Left Arm, Patient Position: Sitting, Cuff Size: Small)   Pulse 78   Temp 98.6 F (37 C) (Oral)   Resp 16   Ht 5\' 1"  (1.549 m)   Wt 160 lb 2 oz (72.6 kg)   SpO2 96%   BMI 30.26 kg/m   General:   Well developed, NAD, BMI noted. HEENT:  Normocephalic . Face symmetric, atraumatic Neck: No JVD at 45 degrees Lungs:  CTA B Normal respiratory effort, no intercostal retractions, no accessory muscle use. Heart: RRR,  no murmur.  Trace pretibial edema (distally) bilaterally. Calves:  Symmetric, measured with tape.  Minimal discomfort to palpation at the L calf  Skin: Not pale. Not jaundice Neurologic:  alert & oriented X3.  Speech normal, gait appropriate for age and unassisted Psych--  Cognition and judgment appear intact.  Cooperative with normal attention span and concentration.  Behavior appropriate. No anxious or depressed appearing.      Assessment       Assessment HTN GERD Asthma Anxiety, insomnia   PLAN: Calf pain: Likely a MSK issue, resolving, recommend observation for now.  Patient is concerned about a clot, chances to have a clot are very low given history, we discussed the ultrasound but at the end we agreed on observation Lower extremity edema: No evidence of CHF, edema is mild, she has been less active.  Recommend increase physical activity, low-salt diet, call if not better Flu shot early this fall encouraged.

## 2019-02-05 NOTE — Patient Instructions (Signed)
Watch your salt intake  Increase physical activity  Call if not gradually better

## 2019-02-05 NOTE — Progress Notes (Signed)
Pre visit review using our clinic review tool, if applicable. No additional management support is needed unless otherwise documented below in the visit note. 

## 2019-02-06 NOTE — Assessment & Plan Note (Signed)
Calf pain: Likely a MSK issue, resolving, recommend observation for now.  Patient is concerned about a clot, chances to have a clot are very low given history, we discussed the ultrasound but at the end we agreed on observation Lower extremity edema: No evidence of CHF, edema is mild, she has been less active.  Recommend increase physical activity, low-salt diet, call if not better Flu shot early this fall encouraged.

## 2019-02-12 LAB — HM PAP SMEAR

## 2019-03-04 ENCOUNTER — Encounter: Payer: Self-pay | Admitting: Internal Medicine

## 2019-04-16 ENCOUNTER — Other Ambulatory Visit: Payer: Self-pay | Admitting: Internal Medicine

## 2019-09-12 ENCOUNTER — Other Ambulatory Visit: Payer: Self-pay | Admitting: Internal Medicine

## 2019-10-20 ENCOUNTER — Other Ambulatory Visit: Payer: Self-pay

## 2019-10-20 ENCOUNTER — Encounter: Payer: Self-pay | Admitting: Internal Medicine

## 2019-10-20 ENCOUNTER — Ambulatory Visit (INDEPENDENT_AMBULATORY_CARE_PROVIDER_SITE_OTHER): Payer: Commercial Managed Care - PPO | Admitting: Internal Medicine

## 2019-10-20 VITALS — BP 146/87 | HR 70 | Temp 97.3°F | Resp 16 | Ht 61.0 in | Wt 147.4 lb

## 2019-10-20 DIAGNOSIS — R7989 Other specified abnormal findings of blood chemistry: Secondary | ICD-10-CM

## 2019-10-20 DIAGNOSIS — D649 Anemia, unspecified: Secondary | ICD-10-CM | POA: Diagnosis not present

## 2019-10-20 DIAGNOSIS — Z0000 Encounter for general adult medical examination without abnormal findings: Secondary | ICD-10-CM | POA: Diagnosis not present

## 2019-10-20 DIAGNOSIS — I10 Essential (primary) hypertension: Secondary | ICD-10-CM

## 2019-10-20 LAB — CBC WITH DIFFERENTIAL/PLATELET
Basophils Absolute: 0 10*3/uL (ref 0.0–0.1)
Basophils Relative: 0.9 % (ref 0.0–3.0)
Eosinophils Absolute: 0.1 10*3/uL (ref 0.0–0.7)
Eosinophils Relative: 1.5 % (ref 0.0–5.0)
HCT: 39.6 % (ref 36.0–46.0)
Hemoglobin: 13.6 g/dL (ref 12.0–15.0)
Lymphocytes Relative: 39.2 % (ref 12.0–46.0)
Lymphs Abs: 1.8 10*3/uL (ref 0.7–4.0)
MCHC: 34.2 g/dL (ref 30.0–36.0)
MCV: 99.8 fl (ref 78.0–100.0)
Monocytes Absolute: 0.3 10*3/uL (ref 0.1–1.0)
Monocytes Relative: 5.9 % (ref 3.0–12.0)
Neutro Abs: 2.5 10*3/uL (ref 1.4–7.7)
Neutrophils Relative %: 52.5 % (ref 43.0–77.0)
Platelets: 253 10*3/uL (ref 150.0–400.0)
RBC: 3.97 Mil/uL (ref 3.87–5.11)
RDW: 13.9 % (ref 11.5–15.5)
WBC: 4.7 10*3/uL (ref 4.0–10.5)

## 2019-10-20 LAB — IRON,TIBC AND FERRITIN PANEL
%SAT: 39 % (calc) (ref 16–45)
Ferritin: 93 ng/mL (ref 16–232)
Iron: 109 ug/dL (ref 40–190)
TIBC: 278 mcg/dL (calc) (ref 250–450)

## 2019-10-20 LAB — LIPID PANEL
Cholesterol: 259 mg/dL — ABNORMAL HIGH (ref 0–200)
HDL: 43.4 mg/dL (ref >39.00)
NonHDL: 215.94
Total CHOL/HDL Ratio: 6
Triglycerides: 300 mg/dL — ABNORMAL HIGH (ref 0.0–149.0)
VLDL: 60 mg/dL — ABNORMAL HIGH (ref 0.0–40.0)

## 2019-10-20 LAB — LDL CHOLESTEROL, DIRECT: Direct LDL: 139 mg/dL

## 2019-10-20 LAB — COMPREHENSIVE METABOLIC PANEL
ALT: 86 U/L — ABNORMAL HIGH (ref 0–35)
AST: 76 U/L — ABNORMAL HIGH (ref 0–37)
Albumin: 4.5 g/dL (ref 3.5–5.2)
Alkaline Phosphatase: 85 U/L (ref 39–117)
BUN: 10 mg/dL (ref 6–23)
CO2: 25 mEq/L (ref 19–32)
Calcium: 9.3 mg/dL (ref 8.4–10.5)
Chloride: 98 mEq/L (ref 96–112)
Creatinine, Ser: 0.69 mg/dL (ref 0.40–1.20)
GFR: 91.59 mL/min (ref >60.00)
Glucose, Bld: 67 mg/dL — ABNORMAL LOW (ref 70–99)
Potassium: 4.3 mEq/L (ref 3.5–5.1)
Sodium: 135 mEq/L (ref 135–145)
Total Bilirubin: 0.7 mg/dL (ref 0.2–1.2)
Total Protein: 7.1 g/dL (ref 6.0–8.3)

## 2019-10-20 LAB — TSH: TSH: 2.17 u[IU]/mL (ref 0.35–4.50)

## 2019-10-20 LAB — VITAMIN D 25 HYDROXY (VIT D DEFICIENCY, FRACTURES): VITD: 90.97 ng/mL (ref 30.00–100.00)

## 2019-10-20 MED ORDER — ALPRAZOLAM 0.5 MG PO TABS: 0.5000 mg | ORAL_TABLET | Freq: Two times a day (BID) | ORAL | 2 refills | Status: DC | PRN

## 2019-10-20 NOTE — Patient Instructions (Addendum)
Check the  blood pressure weekly  BP GOAL is between 110/65 and  135/85. If it is consistently higher or lower, let me know    GO TO THE LAB : Get the blood work     [place], PLEASE SCHEDULE YOUR APPOINTMENTS Come back for   For a check up in 6 months

## 2019-10-20 NOTE — Progress Notes (Signed)
Subjective:    Patient ID: Caitlin Williamson, female    DOB: [birth year], 44 y.o.   MRN: [id number]  DOS:  10/20/2019 Type of visit - description: CPX Feels well except for occasional tightness at the left Achilles tendon and some numbness on the left heel.  Review of Systems  Other than above, a 14 point review of systems is negative    Past Medical History:  Diagnosis Date  . Allergic rhinitis   . ANXIETY 03/11/2010   Qualifier: Diagnosis of  By: Larose Kells MD, [place]   . Cervical cancer ([place]) 2008  . GERD (gastroesophageal reflux disease) 07/30/2013  . Mild HTN     Past Surgical History:  Procedure Laterality Date  . BACK SURGERY  1999   L4-L5  . BREAST BIOPSY     right   . MASS EXCISION  05-2009   shoulder  right  . PARTIAL HYSTERECTOMY  2008   no oophorectomy  . ROTATOR CUFF REPAIR     right   Family History  Problem Relation Age of Onset  . Diabetes Mother   . Hypertension Mother   . Heart failure Mother   . Breast cancer Maternal Grandmother   . Stroke Maternal Grandmother   . Heart disease Maternal Grandmother   . Colon cancer Neg Hx      Allergies as of 10/20/2019      Reactions   Penicillins Anaphylaxis   Sulfonamide Derivatives Anaphylaxis      Medication List       Accurate as of October 20, 2019  1:15 PM. If you have any questions, ask your nurse or doctor.        albuterol 108 (90 Base) MCG/ACT inhaler Commonly known as: VENTOLIN HFA Inhale 2 puffs into the lungs every 6 (six) hours as needed for wheezing or shortness of breath.   ALPRAZolam 0.5 MG tablet Commonly known as: XANAX Take 1 tablet (0.5 mg total) by mouth 2 (two) times daily as needed for anxiety or sleep.   beclomethasone 80 MCG/ACT inhaler Commonly known as: Qvar RediHaler Inhale 2 puffs into the lungs 2 (two) times daily.   fluticasone 50 MCG/ACT nasal spray Commonly known as: FLONASE Place 2 sprays into both nostrils daily.   ibuprofen 200 MG tablet Commonly  known as: ADVIL Take 400-600 mg by mouth every 6 (six) hours as needed. For pain   losartan 50 MG tablet Commonly known as: COZAAR Take 1 tablet (50 mg total) by mouth daily.   montelukast 10 MG tablet Commonly known as: SINGULAIR Take 1 tablet (10 mg total) by mouth at bedtime.   omeprazole 40 MG capsule Commonly known as: PRILOSEC Take 1 capsule (40 mg total) by mouth daily.          Objective:   Physical Exam BP (!) 146/87 (BP Location: Left Arm, Patient Position: Sitting, Cuff Size: Small)   Pulse 70   Temp (!) 97.3 F (36.3 C) (Temporal)   Resp 16   Ht 5\' 1"  (1.549 m)   Wt 147 lb 6 oz (66.8 kg)   SpO2 100%   BMI 27.85 kg/m  General: Well developed, NAD, BMI noted Neck: No  thyromegaly  HEENT:  Normocephalic . Face symmetric, atraumatic Lungs:  CTA B Normal respiratory effort, no intercostal retractions, no accessory muscle use. Heart: RRR,  no murmur.  Abdomen:  Not distended, soft, non-tender. No rebound or rigidity.   Lower extremities: no pretibial edema bilaterally  Skin: Exposed  areas without rash. Not pale. Not jaundice Neurologic:  alert & oriented X3.  Speech normal, gait appropriate for age and unassisted Strength symmetric and appropriate for age.  Psych: Cognition and judgment appear intact.  Cooperative with normal attention span and concentration.  Behavior appropriate. No anxious or depressed appearing.     Assessment    ASSESSMENT HTN GERD Asthma Anxiety, insomnia   PLAN: Here for CPX HTN:  BP today slightly elevated 146/87, I rechecked and obtained the same number.  Plan: Continue losartan, watch salt intake, increase physical activity, monitor BPs at home, see AVS GERD: Well-controlled Asthma: Well-controlled Anxiety insomnia: Uses Xanax as needed.  RF sent History of anemia: Currently not on iron supplements, history of hysterectomy, normal colonoscopy few years ago.  She donates blood every 4 months.  Checking CBC and iron  levels.  Depending on results may recommend iron supplements and donate blood less frequently. Calf pain: Currently with chest tightness at the left Achilles tendon and some numbness of the left heel.  Recommended stretching. RTC 6 months mostly to check BPs   This visit occurred during the SARS-CoV-2 public health emergency.  Safety protocols were in place, including screening questions prior to the visit, additional usage of staff PPE, and extensive cleaning of exam room while observing appropriate contact time as indicated for disinfecting solutions.

## 2019-10-20 NOTE — Progress Notes (Signed)
Pre visit review using our clinic review tool, if applicable. No additional management support is needed unless otherwise documented below in the visit note. 

## 2019-10-20 NOTE — Assessment & Plan Note (Signed)
-  Td 2013 - pnm 23: 02-2015  - to get 2nd covid shot tomorrow -Sees gyn, Dr Philis Fendt yearly MMG, Pap smears per gynecology -CCS: H/o iron deficiency anemia, Cscope 02-2012 neg, next CCS 2023 -Diet and exercise: Discussed, works from home, very active on her yard, recommend daily walks -Labs: CMP, FLP, CBC, TIBC, TSH, vitamin D

## 2019-10-20 NOTE — Assessment & Plan Note (Signed)
Here for CPX HTN:  BP today slightly elevated 146/87, I rechecked and obtained the same number.  Plan: Continue losartan, watch salt intake, increase physical activity, monitor BPs at home, see AVS GERD: Well-controlled Asthma: Well-controlled Anxiety insomnia: Uses Xanax as needed.  RF sent History of anemia: Currently not on iron supplements, history of hysterectomy, normal colonoscopy few years ago.  She donates blood every 4 months.  Checking CBC and iron levels.  Depending on results may recommend iron supplements and donate blood less frequently. Calf pain: Currently with chest tightness at the left Achilles tendon and some numbness of the left heel.  Recommended stretching. RTC 6 months mostly to check BPs

## 2019-10-23 NOTE — Addendum Note (Signed)
Addended byDamita Dunnings D on: 10/23/2019 09:00 AM   Modules accepted: Orders

## 2019-10-30 ENCOUNTER — Telehealth: Payer: Self-pay | Admitting: Internal Medicine

## 2019-10-30 NOTE — Telephone Encounter (Signed)
-----   Message from [place], [place] sent at 10/23/2019  9:20 AM EDT ----- Regarding: Lab appt Needs nonfasting labs in 4 weeks please.

## 2019-10-30 NOTE — Telephone Encounter (Signed)
Called pt left msg to call back for lab appt

## 2019-11-05 ENCOUNTER — Encounter: Payer: Self-pay | Admitting: Internal Medicine

## 2019-11-13 ENCOUNTER — Other Ambulatory Visit: Payer: Commercial Managed Care - PPO

## 2019-11-14 ENCOUNTER — Other Ambulatory Visit: Payer: Self-pay

## 2019-11-14 ENCOUNTER — Other Ambulatory Visit (INDEPENDENT_AMBULATORY_CARE_PROVIDER_SITE_OTHER): Payer: Commercial Managed Care - PPO

## 2019-11-14 DIAGNOSIS — R7989 Other specified abnormal findings of blood chemistry: Secondary | ICD-10-CM | POA: Diagnosis not present

## 2019-11-14 LAB — HEPATIC FUNCTION PANEL
ALT: 28 U/L (ref 0–35)
AST: 35 U/L (ref 0–37)
Albumin: 4.1 g/dL (ref 3.5–5.2)
Alkaline Phosphatase: 72 U/L (ref 39–117)
Bilirubin, Direct: 0.2 mg/dL (ref 0.0–0.3)
Total Bilirubin: 0.8 mg/dL (ref 0.2–1.2)
Total Protein: 6.7 g/dL (ref 6.0–8.3)

## 2020-01-03 ENCOUNTER — Other Ambulatory Visit: Payer: Self-pay | Admitting: Internal Medicine

## 2020-01-12 ENCOUNTER — Other Ambulatory Visit: Payer: Self-pay | Admitting: Internal Medicine

## 2020-02-05 LAB — HM MAMMOGRAPHY

## 2020-02-25 ENCOUNTER — Encounter: Payer: Self-pay | Admitting: Internal Medicine

## 2020-03-17 ENCOUNTER — Other Ambulatory Visit: Payer: Self-pay | Admitting: Internal Medicine

## 2020-04-29 ENCOUNTER — Ambulatory Visit (INDEPENDENT_AMBULATORY_CARE_PROVIDER_SITE_OTHER): Payer: Commercial Managed Care - PPO | Admitting: Internal Medicine

## 2020-04-29 ENCOUNTER — Encounter: Payer: Self-pay | Admitting: Internal Medicine

## 2020-04-29 ENCOUNTER — Other Ambulatory Visit: Payer: Self-pay

## 2020-04-29 VITALS — BP 149/100 | HR 75 | Temp 97.9°F | Resp 16 | Ht 61.0 in | Wt 153.0 lb

## 2020-04-29 DIAGNOSIS — F411 Generalized anxiety disorder: Secondary | ICD-10-CM

## 2020-04-29 DIAGNOSIS — I10 Essential (primary) hypertension: Secondary | ICD-10-CM | POA: Diagnosis not present

## 2020-04-29 DIAGNOSIS — Z23 Encounter for immunization: Secondary | ICD-10-CM | POA: Diagnosis not present

## 2020-04-29 MED ORDER — AMLODIPINE BESYLATE 5 MG PO TABS: 5.0000 mg | ORAL_TABLET | Freq: Every day | ORAL | 6 refills | Status: DC

## 2020-04-29 NOTE — Patient Instructions (Signed)
Start taking amlodipine 5 mg every night  Continue checking your blood pressures regularly, anticipate they will improve in the next few weeks.  If they are normal please let me know. BP GOAL is between 110/65 and  135/85. If it is consistently higher or lower, let me know     GO TO THE FRONT DESK, PLEASE SCHEDULE YOUR APPOINTMENTS Come back for a check up in 6 months CPX

## 2020-04-29 NOTE — Progress Notes (Signed)
Pre visit review using our clinic review tool, if applicable. No additional management support is needed unless otherwise documented below in the visit note. 

## 2020-04-29 NOTE — Progress Notes (Signed)
Subjective:    Patient ID: Caitlin Williamson, female    DOB: Oct 30, 1975, 44 y.o.   MRN: [id number]  DOS:  04/29/2020 Type of visit - description: Follow-up Today we talk about HTN anxiety. She continue with high stress but seems to be handling well. Does follow a low-salt diet, is trying to exercise but time is an issue   Review of Systems See above   Past Medical History:  Diagnosis Date  . Allergic rhinitis   . ANXIETY 03/11/2010   Qualifier: Diagnosis of  By: Larose Kells MD, [place]   . Cervical cancer ([place]) 2008  . GERD (gastroesophageal reflux disease) 07/30/2013  . Mild HTN     Past Surgical History:  Procedure Laterality Date  . BACK SURGERY  1999   L4-L5  . BREAST BIOPSY     right   . MASS EXCISION  05-2009   shoulder  right  . PARTIAL HYSTERECTOMY  2008   no oophorectomy  . ROTATOR CUFF REPAIR     right    Allergies as of 04/29/2020      Reactions   Penicillins Anaphylaxis   Sulfonamide Derivatives Anaphylaxis      Medication List       Accurate as of April 29, 2020 11:59 PM. If you have any questions, ask your nurse or doctor.        albuterol 108 (90 Base) MCG/ACT inhaler Commonly known as: VENTOLIN HFA Inhale 2 puffs into the lungs every 6 (six) hours as needed for wheezing or shortness of breath.   ALPRAZolam 0.5 MG tablet Commonly known as: XANAX Take 1 tablet (0.5 mg total) by mouth 2 (two) times daily as needed for anxiety or sleep.   amLODipine 5 MG tablet Commonly known as: NORVASC Take 1 tablet (5 mg total) by mouth daily. Started by: Kathlene November, MD   beclomethasone 80 MCG/ACT inhaler Commonly known as: Qvar RediHaler Inhale 2 puffs into the lungs 2 (two) times daily.   fluticasone 50 MCG/ACT nasal spray Commonly known as: FLONASE Place 2 sprays into both nostrils daily.   ibuprofen 200 MG tablet Commonly known as: ADVIL Take 400-600 mg by mouth every 6 (six) hours as needed. For pain   losartan 50 MG tablet Commonly  known as: COZAAR Take 1 tablet (50 mg total) by mouth daily.   montelukast 10 MG tablet Commonly known as: SINGULAIR Take 1 tablet (10 mg total) by mouth at bedtime.   omeprazole 40 MG capsule Commonly known as: PRILOSEC Take 1 capsule (40 mg total) by mouth daily.          Objective:   Physical Exam BP (!) 149/100 (BP Location: Left Arm, Patient Position: Sitting, Cuff Size: Small)   Pulse 75   Temp 97.9 F (36.6 C) (Oral)   Resp 16   Ht 5\' 1"  (1.549 m)   Wt 153 lb (69.4 kg)   SpO2 100%   BMI 28.91 kg/m  General:   Well developed, NAD, BMI noted. HEENT:  Normocephalic . Face symmetric, atraumatic Lungs:  CTA B Normal respiratory effort, no intercostal retractions, no accessory muscle use. Heart: RRR,  no murmur.  Lower extremities: no pretibial edema bilaterally  Skin: Not pale. Not jaundice Neurologic:  alert & oriented X3.  Speech normal, gait appropriate for age and unassisted Psych--  Cognition and judgment appear intact.  Cooperative with normal attention span and concentration.  Behavior appropriate. No anxious or depressed appearing.  Assessment      ASSESSMENT HTN GERD Asthma Anxiety, insomnia   PLAN: HTN: On losartan, BP today slightly elevated, at home ranges from 142-158/89-112. She already follows a low-salt diet, recommend better control, add amlodipine 5 mg, call in few weeks if not at goal.  See AVS. Anxiety, insomnia: Patient has a lot of stress, rec to consider counseling.  Other medications such as SSRIs are an option but she does not feel ready for that.  She takes Xanax as needed only, contract signed, no UDS due to cost. Preventive care: Flu shot today RTC 6 months CPX  This visit occurred during the SARS-CoV-2 public health emergency.  Safety protocols were in place, including screening questions prior to the visit, additional usage of staff PPE, and extensive cleaning of exam room while observing appropriate contact time as  indicated for disinfecting solutions.

## 2020-04-30 NOTE — Assessment & Plan Note (Signed)
HTN: On losartan, BP today slightly elevated, at home ranges from 142-158/89-112. She already follows a low-salt diet, recommend better control, add amlodipine 5 mg, call in few weeks if not at goal.  See AVS. Anxiety, insomnia: Patient has a lot of stress, rec to consider counseling.  Other medications such as SSRIs are an option but she does not feel ready for that.  She takes Xanax as needed only, contract signed, no UDS due to cost. Preventive care: Flu shot today RTC 6 months CPX

## 2020-05-24 ENCOUNTER — Telehealth: Payer: Self-pay | Admitting: Internal Medicine

## 2020-05-24 NOTE — Telephone Encounter (Signed)
PDMP okay, prescription sent 

## 2020-05-24 NOTE — Telephone Encounter (Signed)
Requesting: alprazolam 0.5mg  Contract: 04/29/2020 UDS: None at this time d/t $$$ Last Visit: 04/29/2020 Next Visit: 10/09/2020 Last Refill: 10/20/2019 #60 and 2RF Pt sig: 1 tab bid prn  Please Advise

## 2020-07-15 ENCOUNTER — Other Ambulatory Visit: Payer: Self-pay | Admitting: Internal Medicine

## 2020-08-31 ENCOUNTER — Encounter: Payer: Self-pay | Admitting: Internal Medicine

## 2020-08-31 MED ORDER — LOSARTAN POTASSIUM 50 MG PO TABS: 50.0000 mg | ORAL_TABLET | Freq: Every day | ORAL | 0 refills | Status: DC

## 2020-10-24 ENCOUNTER — Other Ambulatory Visit: Payer: Self-pay | Admitting: Internal Medicine

## 2020-10-29 ENCOUNTER — Encounter: Payer: Commercial Managed Care - PPO | Admitting: Internal Medicine

## 2020-12-14 ENCOUNTER — Encounter: Payer: Self-pay | Admitting: Internal Medicine

## 2020-12-16 ENCOUNTER — Encounter: Payer: Self-pay | Admitting: Internal Medicine

## 2020-12-16 ENCOUNTER — Ambulatory Visit (INDEPENDENT_AMBULATORY_CARE_PROVIDER_SITE_OTHER): Payer: Commercial Managed Care - PPO | Admitting: Internal Medicine

## 2020-12-16 ENCOUNTER — Other Ambulatory Visit: Payer: Self-pay

## 2020-12-16 VITALS — BP 126/94 | HR 94 | Temp 98.1°F | Resp 16 | Ht 61.0 in | Wt 154.1 lb

## 2020-12-16 DIAGNOSIS — R7989 Other specified abnormal findings of blood chemistry: Secondary | ICD-10-CM | POA: Diagnosis not present

## 2020-12-16 DIAGNOSIS — Z1159 Encounter for screening for other viral diseases: Secondary | ICD-10-CM

## 2020-12-16 DIAGNOSIS — I10 Essential (primary) hypertension: Secondary | ICD-10-CM

## 2020-12-16 DIAGNOSIS — Z0000 Encounter for general adult medical examination without abnormal findings: Secondary | ICD-10-CM | POA: Diagnosis not present

## 2020-12-16 LAB — LIPID PANEL
Cholesterol: 253 mg/dL — ABNORMAL HIGH (ref 0–200)
HDL: 45.2 mg/dL (ref >39.00)
LDL Cholesterol: 171 mg/dL — ABNORMAL HIGH (ref 0–99)
NonHDL: 208.1
Total CHOL/HDL Ratio: 6
Triglycerides: 188 mg/dL — ABNORMAL HIGH (ref 0.0–149.0)
VLDL: 37.6 mg/dL (ref 0.0–40.0)

## 2020-12-16 LAB — CBC WITH DIFFERENTIAL/PLATELET
Basophils Absolute: 0 10*3/uL (ref 0.0–0.1)
Basophils Relative: 0.5 % (ref 0.0–3.0)
Eosinophils Absolute: 0.1 10*3/uL (ref 0.0–0.7)
Eosinophils Relative: 1.8 % (ref 0.0–5.0)
HCT: 38.7 % (ref 36.0–46.0)
Hemoglobin: 13.4 g/dL (ref 12.0–15.0)
Lymphocytes Relative: 25.4 % (ref 12.0–46.0)
Lymphs Abs: 1.3 10*3/uL (ref 0.7–4.0)
MCHC: 34.6 g/dL (ref 30.0–36.0)
MCV: 102.5 fl — ABNORMAL HIGH (ref 78.0–100.0)
Monocytes Absolute: 0.3 10*3/uL (ref 0.1–1.0)
Monocytes Relative: 6.2 % (ref 3.0–12.0)
Neutro Abs: 3.4 10*3/uL (ref 1.4–7.7)
Neutrophils Relative %: 66.1 % (ref 43.0–77.0)
Platelets: 250 10*3/uL (ref 150.0–400.0)
RBC: 3.78 Mil/uL — ABNORMAL LOW (ref 3.87–5.11)
RDW: 14.3 % (ref 11.5–15.5)
WBC: 5.2 10*3/uL (ref 4.0–10.5)

## 2020-12-16 LAB — COMPREHENSIVE METABOLIC PANEL
ALT: 78 U/L — ABNORMAL HIGH (ref 0–35)
AST: 83 U/L — ABNORMAL HIGH (ref 0–37)
Albumin: 4.4 g/dL (ref 3.5–5.2)
Alkaline Phosphatase: 83 U/L (ref 39–117)
BUN: 8 mg/dL (ref 6–23)
CO2: 27 mEq/L (ref 19–32)
Calcium: 9.4 mg/dL (ref 8.4–10.5)
Chloride: 102 mEq/L (ref 96–112)
Creatinine, Ser: 0.81 mg/dL (ref 0.40–1.20)
GFR: 86.59 mL/min (ref >60.00)
Glucose, Bld: 98 mg/dL (ref 70–99)
Potassium: 4.1 mEq/L (ref 3.5–5.1)
Sodium: 138 mEq/L (ref 135–145)
Total Bilirubin: 1.1 mg/dL (ref 0.2–1.2)
Total Protein: 7.2 g/dL (ref 6.0–8.3)

## 2020-12-16 LAB — HEMOGLOBIN A1C: Hgb A1c MFr Bld: 5.3 % (ref 4.6–6.5)

## 2020-12-16 NOTE — Progress Notes (Signed)
Subjective:    Patient ID: Caitlin Williamson, female    DOB: 07-31-1975, 45 y.o.   MRN: [id number]  DOS:  12/16/2020 Type of visit - description: CPX  In general doing well. She checks her BPs regularly, in the last 2 weeks numbers have been higher than usual. She denies any symptoms but admits to some stress, family and work-related.   Review of Systems  Other than above, a 14 point review of systems is negative       Past Medical History:  Diagnosis Date   Allergic rhinitis    ANXIETY 03/11/2010   Qualifier: Diagnosis of  By: Larose Kells MD, [place]    Asthma    Cervical cancer ([place]) 2008   GERD (gastroesophageal reflux disease) 07/30/2013   Mild HTN     Past Surgical History:  Procedure Laterality Date   BACK SURGERY  1999   L4-L5   BREAST BIOPSY     right    MASS EXCISION  05-2009   shoulder  right   PARTIAL HYSTERECTOMY  2008   no oophorectomy   ROTATOR CUFF REPAIR     right   Social History   Socioeconomic History   Marital status: Married    Spouse name: Not on file   Number of children: 1   Years of education: Not on file   Highest education level: Not on file  Occupational History   Occupation: Sports coach aviation,travels a lot     Employer: [employer]  Tobacco Use   Smoking status: Former    Pack years: 0.00   Smokeless tobacco: Never  Substance and Sexual Activity   Alcohol use: Not Currently    Comment: daily, moderate   Drug use: No   Sexual activity: Not on file  Other Topics Concern   Not on file  Social History Narrative   Married , household pt and spouse    Son ~ 1995   2 g-baby (girls)   Social Determinants of Health   Financial Resource Strain: Not on file  Food Insecurity: Not on file  Transportation Needs: Not on file  Physical Activity: Not on file  Stress: Not on file  Social Connections: Not on file  Intimate Partner Violence: Not on file    Allergies as of 12/16/2020       Reactions   Penicillins Anaphylaxis   Sulfonamide  Derivatives Anaphylaxis        Medication List        Accurate as of December 16, 2020 11:59 PM. If you have any questions, ask your nurse or doctor.          albuterol 108 (90 Base) MCG/ACT inhaler Commonly known as: VENTOLIN HFA Inhale 2 puffs into the lungs every 6 (six) hours as needed for wheezing or shortness of breath.   ALPRAZolam 0.5 MG tablet Commonly known as: XANAX TAKE 1 TABLET (0.5 MG TOTAL) BY MOUTH 2 (TWO) TIMES DAILY AS NEEDED FOR ANXIETY OR SLEEP.   amLODipine 5 MG tablet Commonly known as: NORVASC Take 1 tablet (5 mg total) by mouth daily.   beclomethasone 80 MCG/ACT inhaler Commonly known as: Qvar RediHaler Inhale 2 puffs into the lungs 2 (two) times daily.   fluticasone 50 MCG/ACT nasal spray Commonly known as: FLONASE Place 2 sprays into both nostrils daily.   ibuprofen 200 MG tablet Commonly known as: ADVIL Take 400-600 mg by mouth every 6 (six) hours as needed. For pain   losartan 50 MG tablet Commonly known  as: COZAAR Take 1 tablet (50 mg total) by mouth daily.   montelukast 10 MG tablet Commonly known as: SINGULAIR Take 1 tablet (10 mg total) by mouth at bedtime.   omeprazole 40 MG capsule Commonly known as: PRILOSEC Take 1 capsule (40 mg total) by mouth daily.           Objective:   Physical Exam BP (!) 126/94 (BP Location: Left Arm, Patient Position: Sitting, Cuff Size: Normal)   Pulse 94   Temp 98.1 F (36.7 C) (Oral)   Resp 16   Ht 5\' 1"  (1.549 m)   Wt 154 lb 2 oz (69.9 kg)   SpO2 98%   BMI 29.12 kg/m  General: Well developed, NAD, BMI noted Neck: No  thyromegaly  HEENT:  Normocephalic . Face symmetric, atraumatic Lungs:  CTA B Normal respiratory effort, no intercostal retractions, no accessory muscle use. Heart: RRR,  no murmur.  Abdomen:  Not distended, soft, non-tender. No rebound or rigidity.   Lower extremities: no pretibial edema bilaterally  Skin: Exposed areas without rash. Not pale. Not  jaundice Neurologic:  alert & oriented X3.  Speech normal, gait appropriate for age and unassisted Strength symmetric and appropriate for age.  Psych: Cognition and judgment appear intact.  Cooperative with normal attention span and concentration.  Behavior appropriate. No anxious or depressed appearing.     Assessment     ASSESSMENT HTN GERD Asthma Anxiety, insomnia   PLAN: Here for CPX HTN: On amlodipine 5 mg, losartan 50 mg.  BP typically controlled except in the last 2 weeks ~140/100.  BP today 126/94. Options discussed, we agreed on a stay on the same medications, watch salt intake closely, check BPs , let me know readings in 4 weeks, consider adjust medication. Asthma: Well controlled. Anxiety insomnia: Admits to some stress, on Xanax as needed, stress management discussed.  UDS waived due to cost. RTC 6 months    This visit occurred during the SARS-CoV-2 public health emergency.  Safety protocols were in place, including screening questions prior to the visit, additional usage of staff PPE, and extensive cleaning of exam room while observing appropriate contact time as indicated for disinfecting solutions.

## 2020-12-16 NOTE — Patient Instructions (Signed)
Check the  blood pressure 2 or 3 times a week BP GOAL is between 110/65 and  135/85. Please call or send a message in 4 weeks: let me know your blood pressure readings   Watch her salt intake   GO TO THE LAB : Get the blood work     [place], [place] back for a checkup in 6 months   Low-Sodium Eating Plan Sodium, which is an element that makes up salt, helps you maintain a healthy balance of fluids in your body. Too much sodium can increase your blood pressure and cause fluid and waste to be held in your body. Your health care provider or dietitian may recommend following this plan if you have high blood pressure (hypertension), kidney disease, liver disease, or heart failure. Eating less sodium can help lower your blood pressure, reduce swelling, and protect your heart, liver, and kidneys. What are tips for following this plan? Reading food labels The Nutrition Facts label lists the amount of sodium in one serving of the food. If you eat more than one serving, you must multiply the listed amount of sodium by the number of servings. Choose foods with less than 140 mg of sodium per serving. Avoid foods with 300 mg of sodium or more per serving. Shopping Look for lower-sodium products, often labeled as "low-sodium" or "no salt added." Always check the sodium content, even if foods are labeled as "unsalted" or "no salt added." Buy fresh foods. Avoid canned foods and pre-made or frozen meals. Avoid canned, cured, or processed meats. Buy breads that have less than 80 mg of sodium per slice.   Cooking Eat more home-cooked food and less restaurant, buffet, and fast food. Avoid adding salt when cooking. Use salt-free seasonings or herbs instead of table salt or sea salt. Check with your health care provider or pharmacist before using salt substitutes. Cook with plant-based oils, such as canola, sunflower, or olive oil.   Meal planning When eating  at a restaurant, ask that your food be prepared with less salt or no salt, if possible. Avoid dishes labeled as brined, pickled, cured, smoked, or made with soy sauce, miso, or teriyaki sauce. Avoid foods that contain MSG (monosodium glutamate). MSG is sometimes added to [place] food, bouillon, and some canned foods. Make meals that can be grilled, baked, poached, roasted, or steamed. These are generally made with less sodium. General information Most people on this plan should limit their sodium intake to 1,500-2,000 mg (milligrams) of sodium each day. What foods should I eat? Fruits Fresh, frozen, or canned fruit. Fruit juice. Vegetables Fresh or frozen vegetables. "No salt added" canned vegetables. "No salt added" tomato sauce and paste. Low-sodium or reduced-sodium tomato and vegetable juice. Grains Low-sodium cereals, including oats, puffed wheat and rice, and shredded wheat. Low-sodium crackers. Unsalted rice. Unsalted pasta. Low-sodium bread. Whole-grain breads and whole-grain pasta. Meats and other proteins Fresh or frozen (no salt added) meat, poultry, seafood, and fish. Low-sodium canned tuna and salmon. Unsalted nuts. Dried peas, beans, and lentils without added salt. Unsalted canned beans. Eggs. Unsalted nut butters. Dairy Milk. Soy milk. Cheese that is naturally low in sodium, such as ricotta cheese, fresh mozzarella, or Swiss cheese. Low-sodium or reduced-sodium cheese. Cream cheese. Yogurt. Seasonings and condiments Fresh and dried herbs and spices. Salt-free seasonings. Low-sodium mustard and ketchup. Sodium-free salad dressing. Sodium-free light mayonnaise. Fresh or refrigerated horseradish. Lemon juice. Vinegar. Other foods Homemade, reduced-sodium, or low-sodium soups. Unsalted popcorn  and pretzels. Low-salt or salt-free chips. The items listed above may not be a complete list of foods and beverages you can eat. Contact a dietitian for more information. What foods should I  avoid? Vegetables Sauerkraut, pickled vegetables, and relishes. Olives. [place] fries. Onion rings. Regular canned vegetables (not low-sodium or reduced-sodium). Regular canned tomato sauce and paste (not low-sodium or reduced-sodium). Regular tomato and vegetable juice (not low-sodium or reduced-sodium). Frozen vegetables in sauces. Grains Instant hot cereals. Bread stuffing, pancake, and biscuit mixes. Croutons. Seasoned rice or pasta mixes. Noodle soup cups. Boxed or frozen macaroni and cheese. Regular salted crackers. Self-rising flour. Meats and other proteins Meat or fish that is salted, canned, smoked, spiced, or pickled. Precooked or cured meat, such as sausages or meat loaves. Berniece Salines. Ham. Pepperoni. Hot dogs. Corned beef. Chipped beef. Salt pork. Jerky. Pickled herring. Anchovies and sardines. Regular canned tuna. Salted nuts. Dairy Processed cheese and cheese spreads. Hard cheeses. Cheese curds. Blue cheese. Feta cheese. String cheese. Regular cottage cheese. Buttermilk. Canned milk. Fats and oils Salted butter. Regular margarine. Ghee. Bacon fat. Seasonings and condiments Onion salt, garlic salt, seasoned salt, table salt, and sea salt. Canned and packaged gravies. Worcestershire sauce. Tartar sauce. Barbecue sauce. Teriyaki sauce. Soy sauce, including reduced-sodium. Steak sauce. Fish sauce. Oyster sauce. Cocktail sauce. Horseradish that you find on the shelf. Regular ketchup and mustard. Meat flavorings and tenderizers. Bouillon cubes. Hot sauce. Pre-made or packaged marinades. Pre-made or packaged taco seasonings. Relishes. Regular salad dressings. Salsa. Other foods Salted popcorn and pretzels. Corn chips and puffs. Potato and tortilla chips. Canned or dried soups. Pizza. Frozen entrees and pot pies. The items listed above may not be a complete list of foods and beverages you should avoid. Contact a dietitian for more information. Summary Eating less sodium can help lower your blood  pressure, reduce swelling, and protect your heart, liver, and kidneys. Most people on this plan should limit their sodium intake to 1,500-2,000 mg (milligrams) of sodium each day. Canned, boxed, and frozen foods are high in sodium. Restaurant foods, fast foods, and pizza are also very high in sodium. You also get sodium by adding salt to food. Try to cook at home, eat more fresh fruits and vegetables, and eat less fast food and canned, processed, or prepared foods. This information is not intended to replace advice given to you by your health care provider. Make sure you discuss any questions you have with your health care provider. Document Revised: 08/01/2019 Document Reviewed: 05/28/2019 Elsevier Patient Education  2021 [employer].

## 2020-12-17 LAB — HEPATITIS C ANTIBODY
Hepatitis C Ab: NONREACTIVE
SIGNAL TO CUT-OFF: 0.01 (ref <1.00)

## 2020-12-18 ENCOUNTER — Encounter: Payer: Self-pay | Admitting: Internal Medicine

## 2020-12-18 NOTE — Assessment & Plan Note (Signed)
Here for CPX HTN: On amlodipine 5 mg, losartan 50 mg.  BP typically controlled except in the last 2 weeks ~140/100.  BP today 126/94. Options discussed, we agreed on a stay on the same medications, watch salt intake closely, check BPs , let me know readings in 4 weeks, consider adjust medication. Asthma: Well controlled. Anxiety insomnia: Admits to some stress, on Xanax as needed, stress management discussed.  UDS waived due to cost. RTC 6 months

## 2020-12-18 NOTE — Assessment & Plan Note (Signed)
-  Td 2013 - pnm 23: 02-2015 - covid vax x 3, rec #4 -Sees gyn, Dr Philis Fendt yearly MMG, Pap smears per gynecology -CCS: H/o iron deficiency anemia, Cscope 02-2012 neg, next CCS 2023 -Diet and exercise:  - labs CMP, FLP, CBC, A1c, hep C

## 2020-12-22 ENCOUNTER — Encounter: Payer: Commercial Managed Care - PPO | Admitting: Internal Medicine

## 2020-12-22 NOTE — Addendum Note (Signed)
Addended byDamita Dunnings D on: 12/22/2020 08:08 AM   Modules accepted: Orders

## 2021-01-19 ENCOUNTER — Other Ambulatory Visit: Payer: Self-pay | Admitting: Internal Medicine

## 2021-01-21 ENCOUNTER — Other Ambulatory Visit: Payer: Self-pay | Admitting: Internal Medicine

## 2021-02-10 LAB — HM PAP SMEAR: HM Pap smear: NEGATIVE

## 2021-02-10 LAB — HM MAMMOGRAPHY

## 2021-02-18 ENCOUNTER — Encounter: Payer: Self-pay | Admitting: Internal Medicine

## 2021-03-31 ENCOUNTER — Other Ambulatory Visit: Payer: Self-pay | Admitting: Internal Medicine

## 2021-04-19 ENCOUNTER — Other Ambulatory Visit: Payer: Self-pay | Admitting: Internal Medicine

## 2021-05-02 ENCOUNTER — Other Ambulatory Visit: Payer: Self-pay

## 2021-05-02 ENCOUNTER — Encounter: Payer: Self-pay | Admitting: Internal Medicine

## 2021-05-02 ENCOUNTER — Ambulatory Visit (INDEPENDENT_AMBULATORY_CARE_PROVIDER_SITE_OTHER): Payer: Commercial Managed Care - PPO | Admitting: Internal Medicine

## 2021-05-02 VITALS — BP 126/84 | HR 96 | Temp 98.4°F | Resp 16 | Ht 61.0 in | Wt 160.4 lb

## 2021-05-02 DIAGNOSIS — R7989 Other specified abnormal findings of blood chemistry: Secondary | ICD-10-CM | POA: Diagnosis not present

## 2021-05-02 DIAGNOSIS — T148XXA Other injury of unspecified body region, initial encounter: Secondary | ICD-10-CM | POA: Diagnosis not present

## 2021-05-02 NOTE — Progress Notes (Signed)
Subjective:    Patient ID: Caitlin Williamson, female    DOB: 05/10/1976, 45 y.o.   MRN: [id number]  DOS:  05/02/2021 Type of visit - description: Acute  Concerned because this morning at some point she noted a hematoma on the right hand. It is not painful, red or hot. Does not recall any injury.  Also, denies any unusual nosebleeds, blood in the stool or blood in the urine.  Review of Systems See above   Past Medical History:  Diagnosis Date   Allergic rhinitis    ANXIETY 03/11/2010   Qualifier: Diagnosis of  By: Larose Kells MD, [place]    Asthma    Cervical cancer ([place]) 2008   GERD (gastroesophageal reflux disease) 07/30/2013   Mild HTN     Past Surgical History:  Procedure Laterality Date   BACK SURGERY  1999   L4-L5   BREAST BIOPSY     right    MASS EXCISION  05-2009   shoulder  right   PARTIAL HYSTERECTOMY  2008   no oophorectomy   ROTATOR CUFF REPAIR     right    Allergies as of 05/02/2021       Reactions   Penicillins Anaphylaxis   Sulfonamide Derivatives Anaphylaxis        Medication List        Accurate as of May 02, 2021  2:32 PM. If you have any questions, ask your nurse or doctor.          albuterol 108 (90 Base) MCG/ACT inhaler Commonly known as: VENTOLIN HFA Inhale 2 puffs into the lungs every 6 (six) hours as needed for wheezing or shortness of breath.   ALPRAZolam 0.5 MG tablet Commonly known as: XANAX TAKE 1 TABLET (0.5 MG TOTAL) BY MOUTH 2 (TWO) TIMES DAILY AS NEEDED FOR ANXIETY OR SLEEP.   amLODipine 5 MG tablet Commonly known as: NORVASC Take 1 tablet (5 mg total) by mouth daily. DUE FOR LABS   beclomethasone 80 MCG/ACT inhaler Commonly known as: Qvar RediHaler Inhale 2 puffs into the lungs 2 (two) times daily.   fluticasone 50 MCG/ACT nasal spray Commonly known as: FLONASE SPRAY 2 SPRAYS INTO EACH NOSTRIL EVERY DAY   ibuprofen 200 MG tablet Commonly known as: ADVIL Take 400-600 mg by mouth every 6 (six) hours as needed. For  pain   losartan 50 MG tablet Commonly known as: COZAAR Take 1 tablet (50 mg total) by mouth daily.   montelukast 10 MG tablet Commonly known as: SINGULAIR Take 1 tablet (10 mg total) by mouth at bedtime.   omeprazole 40 MG capsule Commonly known as: PRILOSEC Take 1 capsule (40 mg total) by mouth daily.           Objective:   Physical Exam Musculoskeletal:       Arms:     Comments: Has superficial, not tender or warm hematoma.   BP 126/84 (BP Location: Left Arm, Patient Position: Sitting, Cuff Size: Small)   Pulse 96   Temp 98.4 F (36.9 C) (Oral)   Resp 16   Ht 5\' 1"  (1.549 m)   Wt 160 lb 6 oz (72.7 kg)   SpO2 98%   BMI 30.30 kg/m  General:   Well developed, NAD, BMI noted. HEENT:  Normocephalic . Face symmetric, atraumatic Lower extremities: no pretibial edema bilaterally  Skin: See graphic.  No petechia anywhere.  No fingernail lesions. Neurologic:  alert & oriented X3.  Speech normal, gait appropriate for age and unassisted Psych--  Cognition and judgment appear intact.  Cooperative with normal attention span and concentration.  Behavior appropriate. No anxious or depressed appearing.      Assessment      ASSESSMENT HTN GERD Asthma Anxiety, insomnia   PLAN: Hematoma, right hand: Seems to be isolated incident with no systemic symptoms, recommend observation, related to a minor injury?Marland Kitchen  To call if this is not resolving or if she has more incidents. Increased LFTs: Repeating labs today.    This visit occurred during the SARS-CoV-2 public health emergency.  Safety protocols were in place, including screening questions prior to the visit, additional usage of staff PPE, and extensive cleaning of exam room while observing appropriate contact time as indicated for disinfecting solutions.

## 2021-05-02 NOTE — Patient Instructions (Addendum)
  GO TO THE LAB : Get the blood work     

## 2021-05-03 LAB — HEPATIC FUNCTION PANEL
ALT: 79 U/L — ABNORMAL HIGH (ref 0–35)
AST: 105 U/L — ABNORMAL HIGH (ref 0–37)
Albumin: 4.4 g/dL (ref 3.5–5.2)
Alkaline Phosphatase: 88 U/L (ref 39–117)
Bilirubin, Direct: 0.3 mg/dL (ref 0.0–0.3)
Total Bilirubin: 1.2 mg/dL (ref 0.2–1.2)
Total Protein: 7.2 g/dL (ref 6.0–8.3)

## 2021-05-03 LAB — HEPATITIS B SURFACE ANTIGEN: Hepatitis B Surface Ag: NONREACTIVE

## 2021-05-03 LAB — HEPATITIS B CORE ANTIBODY, TOTAL: Hep B Core Total Ab: NONREACTIVE

## 2021-05-03 NOTE — Assessment & Plan Note (Signed)
Hematoma, right hand: Seems to be isolated incident with no systemic symptoms, recommend observation, related to a minor injury?Marland Kitchen  To call if this is not resolving or if she has more incidents. Increased LFTs: Repeating labs today.

## 2021-05-05 NOTE — Addendum Note (Signed)
Addended byDamita Dunnings D on: 05/05/2021 01:05 PM   Modules accepted: Orders

## 2021-05-16 ENCOUNTER — Other Ambulatory Visit: Payer: Self-pay | Admitting: Internal Medicine

## 2021-05-20 ENCOUNTER — Other Ambulatory Visit (INDEPENDENT_AMBULATORY_CARE_PROVIDER_SITE_OTHER): Payer: Commercial Managed Care - PPO

## 2021-05-20 ENCOUNTER — Other Ambulatory Visit: Payer: Self-pay

## 2021-05-20 ENCOUNTER — Ambulatory Visit (HOSPITAL_BASED_OUTPATIENT_CLINIC_OR_DEPARTMENT_OTHER)
Admission: RE | Admit: 2021-05-20 | Discharge: 2021-05-20 | Disposition: A | Discharge status: Home / Self Care | Payer: Commercial Managed Care - PPO | Source: Ambulatory Visit | Attending: Internal Medicine | Admitting: Internal Medicine

## 2021-05-20 DIAGNOSIS — R7989 Other specified abnormal findings of blood chemistry: Secondary | ICD-10-CM | POA: Diagnosis not present

## 2021-05-20 DIAGNOSIS — R768 Other specified abnormal immunological findings in serum: Secondary | ICD-10-CM

## 2021-05-20 LAB — IRON: Iron: 56 ug/dL (ref 42–145)

## 2021-05-20 LAB — FERRITIN: Ferritin: 91.3 ng/mL (ref 10.0–291.0)

## 2021-05-24 LAB — PROTEIN ELECTROPHORESIS, SERUM, WITH REFLEX
Albumin ELP: 4.1 g/dL (ref 3.8–4.8)
Alpha 1: 0.3 g/dL (ref 0.2–0.3)
Alpha 2: 0.8 g/dL (ref 0.5–0.9)
Beta 2: 0.4 g/dL (ref 0.2–0.5)
Beta Globulin: 0.5 g/dL (ref 0.4–0.6)
Gamma Globulin: 0.9 g/dL (ref 0.8–1.7)
Total Protein: 6.9 g/dL (ref 6.1–8.1)

## 2021-05-24 LAB — ALPHA-1-ANTITRYPSIN: A-1 Antitrypsin, Ser: 133 mg/dL (ref 83–199)

## 2021-05-24 LAB — ANTI-NUCLEAR AB-TITER (ANA TITER): ANA Titer 1: 1:80 {titer} — ABNORMAL HIGH

## 2021-05-24 LAB — CERULOPLASMIN: Ceruloplasmin: 30 mg/dL (ref 18–53)

## 2021-05-24 LAB — ANTI-SMOOTH MUSCLE ANTIBODY, IGG: Actin (Smooth Muscle) Antibody (IGG): <20 U (ref <20)

## 2021-05-24 LAB — ANA: Anti Nuclear Antibody (ANA): POSITIVE — AB

## 2021-05-25 LAB — TRANSFERRIN SATURATION
IRON SATN MFR SERPL: 15 % Saturation
IRON SERPL-MCNC: 58 ug/dL
TRANSFERRIN SERPL-MCNC: 270 mg/dL

## 2021-05-27 NOTE — Addendum Note (Signed)
Addended byDamita Dunnings D on: 05/27/2021 10:41 AM   Modules accepted: Orders

## 2021-06-06 ENCOUNTER — Encounter: Payer: Self-pay | Admitting: Gastroenterology

## 2021-06-08 ENCOUNTER — Other Ambulatory Visit: Payer: Self-pay | Admitting: Internal Medicine

## 2021-07-18 ENCOUNTER — Other Ambulatory Visit: Payer: Self-pay

## 2021-07-18 ENCOUNTER — Encounter: Payer: Self-pay | Admitting: Gastroenterology

## 2021-07-18 ENCOUNTER — Ambulatory Visit (INDEPENDENT_AMBULATORY_CARE_PROVIDER_SITE_OTHER): Payer: Commercial Managed Care - PPO | Admitting: Gastroenterology

## 2021-07-18 ENCOUNTER — Other Ambulatory Visit (INDEPENDENT_AMBULATORY_CARE_PROVIDER_SITE_OTHER): Payer: Commercial Managed Care - PPO

## 2021-07-18 ENCOUNTER — Other Ambulatory Visit: Payer: Self-pay | Admitting: Internal Medicine

## 2021-07-18 VITALS — BP 128/88 | HR 92 | Ht 61.0 in | Wt 160.0 lb

## 2021-07-18 DIAGNOSIS — R7989 Other specified abnormal findings of blood chemistry: Secondary | ICD-10-CM

## 2021-07-18 LAB — HEPATIC FUNCTION PANEL
ALT: 54 U/L — ABNORMAL HIGH (ref 0–35)
AST: 69 U/L — ABNORMAL HIGH (ref 0–37)
Albumin: 4.5 g/dL (ref 3.5–5.2)
Alkaline Phosphatase: 73 U/L (ref 39–117)
Bilirubin, Direct: 0.1 mg/dL (ref 0.0–0.3)
Total Bilirubin: 0.6 mg/dL (ref 0.2–1.2)
Total Protein: 7.6 g/dL (ref 6.0–8.3)

## 2021-07-18 LAB — PROTIME-INR
INR: 1 ratio (ref 0.8–1.0)
Prothrombin Time: 10.7 s (ref 9.6–13.1)

## 2021-07-18 NOTE — Patient Instructions (Addendum)
If you are age 48 or younger, your body mass index should be between 19-25. Your Body mass index is 30.23 kg/m. If this is out of the aformentioned range listed, please consider follow up with your Primary Care Provider.  ________________________________________________________  The [facility] providers would like to encourage you to use [facility] to communicate with providers for non-urgent requests or questions.  Due to long hold times on the telephone, sending your provider a message by [facility] may be a faster and more efficient way to get a response.  Please allow 48 business hours for a response.  Please remember that this is for non-urgent requests.  _______________________________________________________  Your provider has requested that you go to the basement level for lab work before leaving today. Press "B" on the elevator. The lab is located at the first door on the left as you exit the elevator.  Due to recent changes in healthcare laws, you may see the results of your imaging and laboratory studies on MyChart before your provider has had a chance to review them.  We understand that in some cases there may be results that are confusing or concerning to you. Not all laboratory results come back in the same time frame and the provider may be waiting for multiple results in order to interpret others.  Please give [place] 48 hours in order for your provider to thoroughly review all the results before contacting the office for clarification of your results.   Please decrease alcohol intake, and focus on loosing weight.  You will need to follow up in our office in 3 months (April 2023).  You will need lab work prior to this appointment. You can come to our office at anytime for lab work, no appointment is needed.  Thank you for entrusting me with your care and choosing [facility].  Dr Ardis Hughs

## 2021-07-18 NOTE — Progress Notes (Signed)
HPI: This is a very pleasant 46 year old woman who was referred to me by Colon Branch, MD  to evaluate elevated liver tests.    She has had elevated liver tests on and off for about 2 years.  She has never heard this before.  Liver disease does not run in her family.  She has never had hepatitis.  She does not use IV drugs.  She drinks 2-5 alcoholic beverages daily.  This is 7 days a week.  It is definitely increased to this since COVID.  She thinks she probably drinks too much but she does not consider herself an alcoholic.  She drinks beer, vodka, whiskey.  Likes to drink on football game days.  Especially  She noticed very slight swelling in her ankles or 2 years ago.  Her weight is overall stable, no overt GI bleeding.  No encephalopathy.  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  Sounds like she travels a lot.  Old Data Reviewed: Blood work April 2021 showed AST 76, ALT 86.  Remaining liver tests all normal Blood work June 2022 showed AST 83, ALT 78, remaining liver tests all normal. Blood work October 2022 AST 105, ALT 79 remaining liver tests all normal. Blood work November 2022 ANA positive, 1-80 titer, nuclear, dense fine speckled.  "Seen in normal individuals and rarely associated with systemic lupus erythematosus, Sjogren's and systemic sclerosis".  Ceruloplasmin normal, alpha-1 antitrypsin level normal, smooth muscle antibody negative.  SPEP normal, ferritin 91, iron normal, hepatitis B surface antigen negative, hepatitis B core total antibody negative, Blood work June 2022 hepatitis C antibody negative  Abdominal ultrasound November 2022 indication "elevated LFTs" findings normal abdominal ultrasound.  Gallbladder appeared normal, liver appeared normal.   Still needs hepatitis A total antibody, hepatitis B surface antibody, antimitochondrial antibody, INR  Colonoscopy August 2023 was normal.    Review of systems: Pertinent positive and negative  review of systems were noted in the above HPI section. All other review negative.   Past Medical History:  Diagnosis Date   Allergic rhinitis    ANXIETY 03/11/2010   Qualifier: Diagnosis of  By: Larose Kells MD, [place]    Asthma    Cervical cancer ([place]) 2008   GERD (gastroesophageal reflux disease) 07/30/2013   Mild HTN     Past Surgical History:  Procedure Laterality Date   BACK SURGERY  1999   L4-L5   BREAST BIOPSY     right    MASS EXCISION  05-2009   shoulder  right   PARTIAL HYSTERECTOMY  2008   no oophorectomy   ROTATOR CUFF REPAIR     right    Current Outpatient Medications  Medication Sig Dispense Refill   albuterol (VENTOLIN HFA) 108 (90 Base) MCG/ACT inhaler Inhale 2 puffs into the lungs every 6 (six) hours as needed for wheezing or shortness of breath. 18 g 5   ALPRAZolam (XANAX) 0.5 MG tablet TAKE 1 TABLET (0.5 MG TOTAL) BY MOUTH 2 (TWO) TIMES DAILY AS NEEDED FOR ANXIETY OR SLEEP. 60 tablet 1   amLODipine (NORVASC) 5 MG tablet Take 1 tablet (5 mg total) by mouth daily. 90 tablet 1   beclomethasone (QVAR REDIHALER) 80 MCG/ACT inhaler Inhale 2 puffs into the lungs 2 (two) times daily. 10.6 g 5   fluticasone (FLONASE) 50 MCG/ACT nasal spray SPRAY 2 SPRAYS INTO EACH NOSTRIL EVERY DAY (Patient taking differently: Place 2 sprays into both nostrils as needed.) 48 mL 3  ibuprofen (ADVIL,MOTRIN) 200 MG tablet Take 400-600 mg by mouth every 6 (six) hours as needed. For pain     losartan (COZAAR) 50 MG tablet Take 1 tablet (50 mg total) by mouth daily. 90 tablet 1   montelukast (SINGULAIR) 10 MG tablet Take 1 tablet (10 mg total) by mouth at bedtime. 90 tablet 1   omeprazole (PRILOSEC) 40 MG capsule Take 1 capsule (40 mg total) by mouth daily. 90 capsule 1   No current facility-administered medications for this visit.    Allergies as of 07/18/2021 - Review Complete 07/18/2021  Allergen Reaction Noted   Penicillins Anaphylaxis 10/01/2007   Sulfonamide derivatives Anaphylaxis  10/01/2007    Family History  Problem Relation Age of Onset   Diabetes Mother    Hypertension Mother    Heart failure Mother    Breast cancer Maternal Grandmother    Stroke Maternal Grandmother    Heart disease Maternal Grandmother    Colon cancer Neg Hx     Social History   Socioeconomic History   Marital status: Married    Spouse name: Not on file   Number of children: 1   Years of education: Not on file   Highest education level: Not on file  Occupational History   Occupation: Sports coach aviation,travels a lot     Employer: [employer]  Tobacco Use   Smoking status: Former   Smokeless tobacco: Never  Scientific laboratory technician Use: Never used  Substance and Sexual Activity   Alcohol use: Yes    Comment: daily, moderate (2 to 5 drinks a day)   Drug use: No   Sexual activity: Not on file  Other Topics Concern   Not on file  Social History Narrative   Married , household pt and spouse    Son ~ 1995   2 g-baby (girls)   Social Determinants of Radio broadcast assistant Strain: Not on file  Food Insecurity: Not on file  Transportation Needs: Not on file  Physical Activity: Not on file  Stress: Not on file  Social Connections: Not on file  Intimate Partner Violence: Not on file     Physical Exam: BP 128/88    Pulse 92    Ht 5\' 1"  (1.549 m)    Wt 160 lb (72.6 kg)    SpO2 98%    BMI 30.23 kg/m  Constitutional: generally well-appearing Psychiatric: alert and oriented x3 Eyes: extraocular movements intact Mouth: oral pharynx moist, no lesions Neck: supple no lymphadenopathy Cardiovascular: heart regular rate and rhythm Lungs: clear to auscultation bilaterally Abdomen: soft, nontender, nondistended, no obvious ascites, no peritoneal signs, normal bowel sounds Extremities: Trace lower extremity edema bilaterally Skin: no lesions on visible extremities   Assessment and plan: 46 y.o. female with slight elevated liver tests  We had a nice discussion about her liver  test elevation.  She drinks 3-5 alcoholic beverages daily and knows that this might be contributing.  Plus her BMI is 30 and she might have a component of [place] as well.  I recommended she try to cut back significantly on her alcohol intake and we could see how her liver enzymes respond over several weeks, months.  If she cuts back significant her alcohol intake I suspect she would probably also be cutting back on her caloric intake and that might help if there is indeed a component of Karlene Lineman here.  Her ANA was slightly positive, 1-80 titer.  This is very nonspecific.  SPEP was negative.  I doubt she has underlying autoimmune hepatitis but she had does understand that if her liver tests remain elevated despite the above we might need to proceed with liver biopsy.  She will get a repeat set of labs today including coags, AMA, hepatitis A serologies, hepatitis B surface antibody to round out her liver test work-up.  Please see the "Patient Instructions" section for addition details about the plan.   Owens Loffler, MD [facility] 07/18/2021, 8:36 AM  Cc: Colon Branch, MD  Total time on date of encounter was 45  minutes (this included time spent preparing to see the patient reviewing records; obtaining and/or reviewing separately obtained history; performing a medically appropriate exam and/or evaluation; counseling and educating the patient and family if present; ordering medications, tests or procedures if applicable; and documenting clinical information in the health record).

## 2021-07-19 ENCOUNTER — Other Ambulatory Visit: Payer: Self-pay | Admitting: Internal Medicine

## 2021-07-20 LAB — HEPATITIS B SURFACE ANTIBODY,QUALITATIVE: Hep B S Ab: NONREACTIVE

## 2021-07-20 LAB — HEPATITIS A ANTIBODY, TOTAL: Hepatitis A AB,Total: NONREACTIVE

## 2021-07-20 LAB — MITOCHONDRIAL ANTIBODIES: Mitochondrial M2 Ab, IgG: <=20 U (ref <=20.0)

## 2021-07-29 ENCOUNTER — Other Ambulatory Visit: Payer: Self-pay

## 2021-07-29 DIAGNOSIS — R7989 Other specified abnormal findings of blood chemistry: Secondary | ICD-10-CM

## 2021-08-04 ENCOUNTER — Telehealth: Payer: Self-pay

## 2021-08-04 NOTE — Telephone Encounter (Signed)
Pt called back & was given lab results. No further questions.

## 2021-09-05 ENCOUNTER — Telehealth: Payer: Self-pay

## 2021-09-05 NOTE — Telephone Encounter (Signed)
-----   Message from [place] sent at 07/18/2021  9:00 AM EST ----- Regarding: 3 mo flup; elevated LFTs Needs to see DJ in April.  Needs LFTs done prior to appt

## 2021-09-05 NOTE — Telephone Encounter (Signed)
Left message for patient to return call to make follow up appointment with Dr Ardis Hughs in April.  Will remind patient that she will need lab work prior to the appointment.  Will continue efforts.

## 2021-09-06 NOTE — Telephone Encounter (Signed)
Patient aware of appointment scheduled for 10-31-21 at 210pm with Dr Ardis Hughs.  Patient advised that she will need lab work prior to appointment.  Patient agreed to plan and verbalized understanding.  No further questions.

## 2021-10-31 ENCOUNTER — Other Ambulatory Visit (INDEPENDENT_AMBULATORY_CARE_PROVIDER_SITE_OTHER): Payer: Commercial Managed Care - PPO

## 2021-10-31 DIAGNOSIS — R7989 Other specified abnormal findings of blood chemistry: Secondary | ICD-10-CM | POA: Diagnosis not present

## 2021-10-31 LAB — HEPATIC FUNCTION PANEL
ALT: 59 U/L — ABNORMAL HIGH (ref 0–35)
AST: 57 U/L — ABNORMAL HIGH (ref 0–37)
Albumin: 4.4 g/dL (ref 3.5–5.2)
Alkaline Phosphatase: 88 U/L (ref 39–117)
Bilirubin, Direct: 0.2 mg/dL (ref 0.0–0.3)
Total Bilirubin: 0.9 mg/dL (ref 0.2–1.2)
Total Protein: 7.6 g/dL (ref 6.0–8.3)

## 2021-11-30 ENCOUNTER — Encounter: Payer: Self-pay | Admitting: Internal Medicine

## 2021-12-06 ENCOUNTER — Telehealth: Payer: Self-pay | Admitting: Gastroenterology

## 2021-12-06 ENCOUNTER — Ambulatory Visit: Payer: Commercial Managed Care - PPO | Admitting: Gastroenterology

## 2021-12-06 NOTE — Progress Notes (Deleted)
Review of pertinent gastrointestinal problems: 1.  Elevated liver tests: AST and ALT only.  Chronic daily alcohol overuse likely contributes. Blood work November 2022 ANA positive, 1-80 titer, nuclear, dense fine speckled.  "Seen in normal individuals and rarely associated with systemic lupus erythematosus, Sjogren's and systemic sclerosis".  Ceruloplasmin normal, alpha-1 antitrypsin level normal, smooth muscle antibody negative.  SPEP normal, ferritin 91, iron normal, hepatitis B surface antigen negative, hepatitis B core total antibody negative.  June 2022 hepatitis C antibody negative.  Blood work 07/2021 hepatitis A total antibody negative, hepatitis B surface antibody negative, AMA normal.  INR normal. Abdominal ultrasound 05/2021 liver was essentially normal.

## 2021-12-06 NOTE — Telephone Encounter (Signed)
Good Morning Dr. Ardis Hughs,  Patient called stating she needed to reschedule her appointment with you this morning at 9:30 due to a scheduling conflict.  Patient was rescheduled for 7/25 at 2:10.

## 2021-12-07 ENCOUNTER — Encounter: Payer: Self-pay | Admitting: Internal Medicine

## 2021-12-14 ENCOUNTER — Encounter: Payer: Self-pay | Admitting: Internal Medicine

## 2021-12-25 ENCOUNTER — Other Ambulatory Visit: Payer: Self-pay | Admitting: Internal Medicine

## 2022-01-12 ENCOUNTER — Other Ambulatory Visit: Payer: Self-pay | Admitting: Internal Medicine

## 2022-01-23 ENCOUNTER — Ambulatory Visit (INDEPENDENT_AMBULATORY_CARE_PROVIDER_SITE_OTHER): Payer: Commercial Managed Care - PPO | Admitting: Internal Medicine

## 2022-01-23 ENCOUNTER — Encounter: Payer: Self-pay | Admitting: Internal Medicine

## 2022-01-23 VITALS — BP 122/84 | HR 72 | Temp 98.5°F | Resp 16 | Ht 61.0 in | Wt 156.1 lb

## 2022-01-23 DIAGNOSIS — Z0000 Encounter for general adult medical examination without abnormal findings: Secondary | ICD-10-CM | POA: Diagnosis not present

## 2022-01-23 DIAGNOSIS — I10 Essential (primary) hypertension: Secondary | ICD-10-CM | POA: Diagnosis not present

## 2022-01-23 DIAGNOSIS — R7989 Other specified abnormal findings of blood chemistry: Secondary | ICD-10-CM

## 2022-01-23 DIAGNOSIS — Z09 Encounter for follow-up examination after completed treatment for conditions other than malignant neoplasm: Secondary | ICD-10-CM

## 2022-01-23 DIAGNOSIS — Z23 Encounter for immunization: Secondary | ICD-10-CM

## 2022-01-23 LAB — CBC WITH DIFFERENTIAL/PLATELET
Basophils Absolute: 0 10*3/uL (ref 0.0–0.1)
Basophils Relative: 0.8 % (ref 0.0–3.0)
Eosinophils Absolute: 0.1 10*3/uL (ref 0.0–0.7)
Eosinophils Relative: 1.3 % (ref 0.0–5.0)
HCT: 38.9 % (ref 36.0–46.0)
Hemoglobin: 13.3 g/dL (ref 12.0–15.0)
Lymphocytes Relative: 35.4 % (ref 12.0–46.0)
Lymphs Abs: 1.9 10*3/uL (ref 0.7–4.0)
MCHC: 34.3 g/dL (ref 30.0–36.0)
MCV: 104.6 fl — ABNORMAL HIGH (ref 78.0–100.0)
Monocytes Absolute: 0.4 10*3/uL (ref 0.1–1.0)
Monocytes Relative: 8.3 % (ref 3.0–12.0)
Neutro Abs: 2.8 10*3/uL (ref 1.4–7.7)
Neutrophils Relative %: 54.2 % (ref 43.0–77.0)
Platelets: 277 10*3/uL (ref 150.0–400.0)
RBC: 3.72 Mil/uL — ABNORMAL LOW (ref 3.87–5.11)
RDW: 16 % — ABNORMAL HIGH (ref 11.5–15.5)
WBC: 5.3 10*3/uL (ref 4.0–10.5)

## 2022-01-23 LAB — LIPID PANEL
Cholesterol: 255 mg/dL — ABNORMAL HIGH (ref 0–200)
HDL: 50 mg/dL (ref >39.00)
LDL Cholesterol: 180 mg/dL — ABNORMAL HIGH (ref 0–99)
NonHDL: 204.87
Total CHOL/HDL Ratio: 5
Triglycerides: 126 mg/dL (ref 0.0–149.0)
VLDL: 25.2 mg/dL (ref 0.0–40.0)

## 2022-01-23 LAB — COMPREHENSIVE METABOLIC PANEL
ALT: 75 U/L — ABNORMAL HIGH (ref 0–35)
AST: 75 U/L — ABNORMAL HIGH (ref 0–37)
Albumin: 4.4 g/dL (ref 3.5–5.2)
Alkaline Phosphatase: 84 U/L (ref 39–117)
BUN: 8 mg/dL (ref 6–23)
CO2: 27 mEq/L (ref 19–32)
Calcium: 9.3 mg/dL (ref 8.4–10.5)
Chloride: 100 mEq/L (ref 96–112)
Creatinine, Ser: 0.83 mg/dL (ref 0.40–1.20)
GFR: 83.45 mL/min (ref >60.00)
Glucose, Bld: 96 mg/dL (ref 70–99)
Potassium: 4.3 mEq/L (ref 3.5–5.1)
Sodium: 137 mEq/L (ref 135–145)
Total Bilirubin: 0.6 mg/dL (ref 0.2–1.2)
Total Protein: 6.8 g/dL (ref 6.0–8.3)

## 2022-01-23 NOTE — Patient Instructions (Addendum)
Recommend to proceed with covid booster (bivalent) at your pharmacy.   Check the  blood pressure regularly BP GOAL is between 110/65 and  135/85. If it is consistently higher or lower, let me know      GO TO THE LAB : Get the blood work     [place], [place] back for a physical exam in 1 year

## 2022-01-23 NOTE — Assessment & Plan Note (Signed)
Here for CPX HTN: Well-controlled: Continue amlodipine, losartan.  Checking labs Asthma: Controlled. Increased LFTs: Work-up showed + ANA, all other tests negative, saw GI 07-2021, they rx to decrease EtOH intake (she decreased from  from 3-5 drinks daily to no etoh some days) and weight loss. Has a follow-up w/ GI soon. Anxiety, insomnia: On Xanax as needed, contract today, waive UDS due to cost. RTC 1 year

## 2022-01-23 NOTE — Assessment & Plan Note (Signed)
-  Td 2013 - pnm 23: 02-2015 - covid vax x 3, rec #4 -Sees gyn, Dr Mancel Bale,  [facility] [phone] -CCS: H/o iron deficiency anemia, Cscope 02-2012 neg, options discussed, elected iFOB -Diet and exercise: Discussed - labs CMP, FLP, CBC

## 2022-01-23 NOTE — Progress Notes (Signed)
Subjective:    Patient ID: Caitlin Williamson, female    DOB: 08/30/1975, 46 y.o.   MRN: [id number]  DOS:  01/23/2022 Type of visit - description: CPX  Since the last office visit she is doing well. Went to see GI regards increased LFTs.  Note reviewed.  Wt Readings from Last 3 Encounters:  01/23/22 156 lb 2 oz (70.8 kg)  07/18/21 160 lb (72.6 kg)  05/02/21 160 lb 6 oz (72.7 kg)    Review of Systems   A 14 point review of systems is negative    Past Medical History:  Diagnosis Date   Allergic rhinitis    ANXIETY 03/11/2010   Qualifier: Diagnosis of  By: Larose Kells MD, Yarnell    Asthma    Cervical cancer ([place]) 2008   GERD (gastroesophageal reflux disease) 07/30/2013   Mild HTN     Past Surgical History:  Procedure Laterality Date   BACK SURGERY  1999   L4-L5   BREAST BIOPSY     right    MASS EXCISION  05-2009   shoulder  right   PARTIAL HYSTERECTOMY  2008   no oophorectomy   ROTATOR CUFF REPAIR     right   Social History   Socioeconomic History   Marital status: Married    Spouse name: Not on file   Number of children: 1   Years of education: Not on file   Highest education level: Not on file  Occupational History   Occupation: Sports coach aviation,travels a lot     Employer: [employer]  Tobacco Use   Smoking status: Former   Smokeless tobacco: Never  Scientific laboratory technician Use: Never used  Substance and Sexual Activity   Alcohol use: Yes    Comment: daily, moderate (2 to 5 drinks a day)   Drug use: No   Sexual activity: Not on file  Other Topics Concern   Not on file  Social History Narrative   Married , household pt and spouse    Son ~ 1995   2 g-baby (girls)   Social Determinants of Radio broadcast assistant Strain: Not on file  Food Insecurity: Not on file  Transportation Needs: Not on file  Physical Activity: Not on file  Stress: Not on file  Social Connections: Not on file  Intimate Partner Violence: Not on file     Current Outpatient  Medications  Medication Instructions   albuterol (VENTOLIN HFA) 108 (90 Base) MCG/ACT inhaler 2 puffs, Inhalation, Every 6 hours PRN   ALPRAZolam (XANAX) 0.5 mg, Oral, 2 times daily PRN   amLODipine (NORVASC) 5 mg, Oral, Daily   beclomethasone (QVAR REDIHALER) 80 MCG/ACT inhaler 2 puffs, Inhalation, 2 times daily   fluticasone (FLONASE) 50 MCG/ACT nasal spray SPRAY 2 SPRAYS INTO EACH NOSTRIL EVERY DAY   ibuprofen (ADVIL) 400-600 mg, Every 6 hours PRN   losartan (COZAAR) 50 MG tablet TAKE 1 TABLET BY MOUTH EVERY DAY   montelukast (SINGULAIR) 10 MG tablet TAKE 1 TABLET BY MOUTH EVERYDAY AT BEDTIME   omeprazole (PRILOSEC) 40 mg, Oral, Daily       Objective:   Physical Exam BP 122/84   Pulse 72   Temp 98.5 F (36.9 C) (Oral)   Resp 16   Ht '5\' 1"'$  (1.549 m)   Wt 156 lb 2 oz (70.8 kg)   SpO2 95%   BMI 29.50 kg/m  General: Well developed, NAD, BMI noted Neck: No  thyromegaly  HEENT:  Normocephalic .  Face symmetric, atraumatic Lungs:  CTA B Normal respiratory effort, no intercostal retractions, no accessory muscle use. Heart: RRR,  no murmur.  Abdomen:  Not distended, soft, non-tender. No rebound or rigidity.   Lower extremities: no pretibial edema bilaterally  Skin: Exposed areas without rash. Not pale. Not jaundice Neurologic:  alert & oriented X3.  Speech normal, gait appropriate for age and unassisted Strength symmetric and appropriate for age.  Psych: Cognition and judgment appear intact.  Cooperative with normal attention span and concentration.  Behavior appropriate. No anxious or depressed appearing.     Assessment     ASSESSMENT HTN GERD Asthma Anxiety, insomnia Increased LFTs: + ANA 05-2021.  Other tests negative, saw GI.  EtOH?   PLAN: Here for CPX HTN: Well-controlled: Continue amlodipine, losartan.  Checking labs Asthma: Controlled. Increased LFTs: Work-up showed + ANA, all other tests negative, saw GI 07-2021, they rx to decrease EtOH intake (she  decreased from  from 3-5 drinks daily to no etoh some days) and weight loss. Has a follow-up w/ GI soon. Anxiety, insomnia: On Xanax as needed, contract today, waive UDS due to cost. RTC 1 year

## 2022-01-24 NOTE — Addendum Note (Signed)
Addended byDamita Dunnings D on: 01/24/2022 03:37 PM   Modules accepted: Orders

## 2022-01-31 ENCOUNTER — Ambulatory Visit: Payer: Commercial Managed Care - PPO | Admitting: Gastroenterology

## 2022-01-31 NOTE — Progress Notes (Deleted)
Review of pertinent gastrointestinal problems: 1.  Elevated liver tests, (3-5 alcoholic beverages daily, slightly obese) at least as far back as 2021 slightly elevated AST and ALT.  Blood work November 2022 ANA positive, 1-80 titer, nuclear, dense fine speckled.  "Seen in normal individuals and rarely associated with systemic lupus erythematosus, Sjogren's and systemic sclerosis".  Ceruloplasmin normal, alpha-1 antitrypsin level normal, smooth muscle antibody negative.  SPEP normal, ferritin 91, iron normal, hepatitis B surface antigen negative, hepatitis B core total antibody negative, Blood work June 2022 hepatitis C antibody negative.  Blood work January 2023 hepatitis B surface antibody negative, hepatitis A total antibody negative, INR normal, AMA normal.  Abdominal ultrasound November 2022 indication "elevated LFTs" findings normal abdominal ultrasound.  Gallbladder appeared normal, liver appeared normal. 2.  Routine risk for colon cancer colonoscopy 02/2012 was normal

## 2022-02-02 ENCOUNTER — Encounter: Payer: Self-pay | Admitting: Gastroenterology

## 2022-02-15 LAB — HM MAMMOGRAPHY

## 2022-02-15 LAB — RESULTS CONSOLE HPV: CHL HPV: NEGATIVE

## 2022-02-15 LAB — HM PAP SMEAR

## 2022-03-01 ENCOUNTER — Encounter: Payer: Self-pay | Admitting: Internal Medicine

## 2022-03-03 ENCOUNTER — Other Ambulatory Visit (INDEPENDENT_AMBULATORY_CARE_PROVIDER_SITE_OTHER): Payer: Commercial Managed Care - PPO

## 2022-03-03 DIAGNOSIS — Z0000 Encounter for general adult medical examination without abnormal findings: Secondary | ICD-10-CM | POA: Diagnosis not present

## 2022-03-06 LAB — FECAL OCCULT BLOOD, IMMUNOCHEMICAL: Fecal Occult Bld: NEGATIVE

## 2022-03-11 ENCOUNTER — Other Ambulatory Visit: Payer: Self-pay | Admitting: Internal Medicine

## 2022-06-29 ENCOUNTER — Encounter: Payer: Self-pay | Admitting: Internal Medicine

## 2022-06-29 MED ORDER — ALPRAZOLAM 0.5 MG PO TABS
0.5000 mg | ORAL_TABLET | Freq: Two times a day (BID) | ORAL | 1 refills | Status: DC | PRN
Start: 2022-06-29 — End: 2023-08-17

## 2022-06-29 NOTE — Telephone Encounter (Signed)
Requesting: alprazolam 0.'5mg'$   Contract: 01/23/22 UDS: None Last Visit: 01/23/22 Next Visit: 01/26/23 Last Refill: 05/24/20 #60 and 1RF   Please Advise

## 2022-07-06 ENCOUNTER — Other Ambulatory Visit: Payer: Self-pay | Admitting: Internal Medicine

## 2022-10-06 ENCOUNTER — Other Ambulatory Visit: Payer: Self-pay | Admitting: Internal Medicine

## 2023-01-02 ENCOUNTER — Other Ambulatory Visit: Payer: Self-pay | Admitting: Internal Medicine

## 2023-01-26 ENCOUNTER — Encounter: Payer: Commercial Managed Care - PPO | Admitting: Internal Medicine

## 2023-04-08 ENCOUNTER — Other Ambulatory Visit: Payer: Self-pay | Admitting: Internal Medicine

## 2023-04-21 IMAGING — US US ABDOMEN COMPLETE
1 series · 14 of 25 positions shown · non-contrast
Comparison: None.

CLINICAL DATA: Elevated LFTs

EXAM:
ABDOMEN ULTRASOUND COMPLETE

[Series 1: us abdomen complete · 14 of 165 slices shown]
[im 1/165]
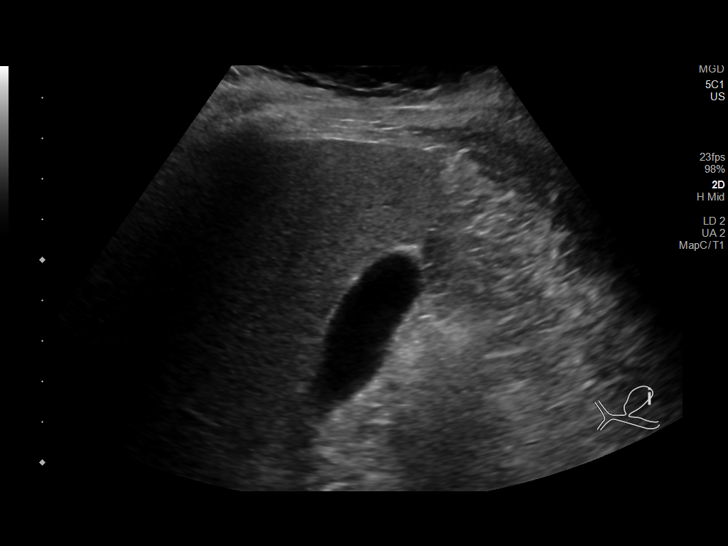
[im 14/165]
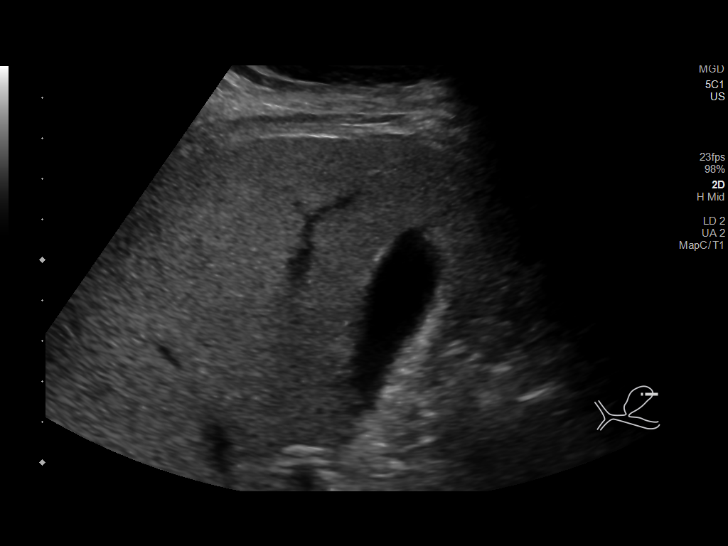
[im 28/165]
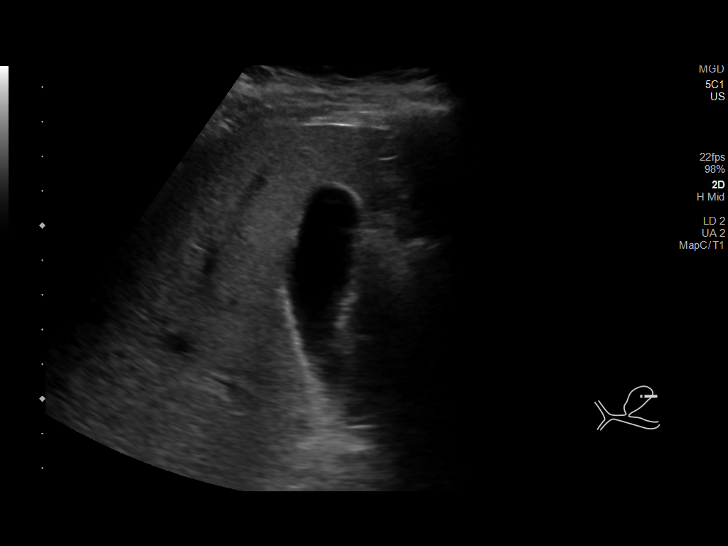
[im 42/165]
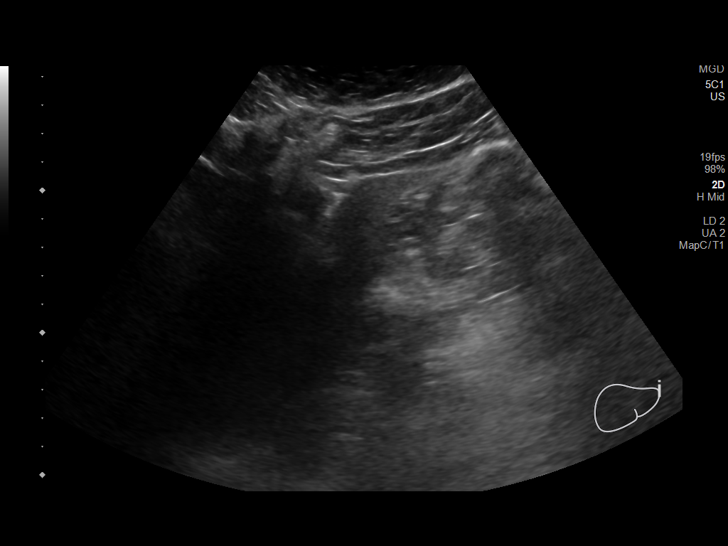
[im 55/165]
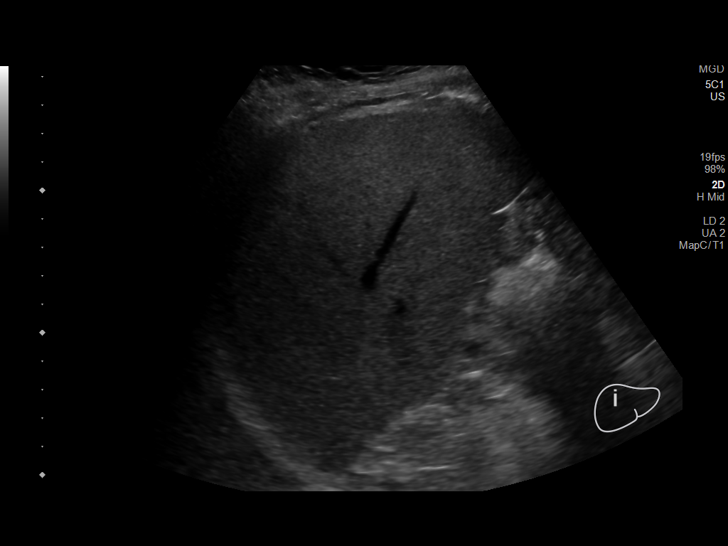
[im 62/165]
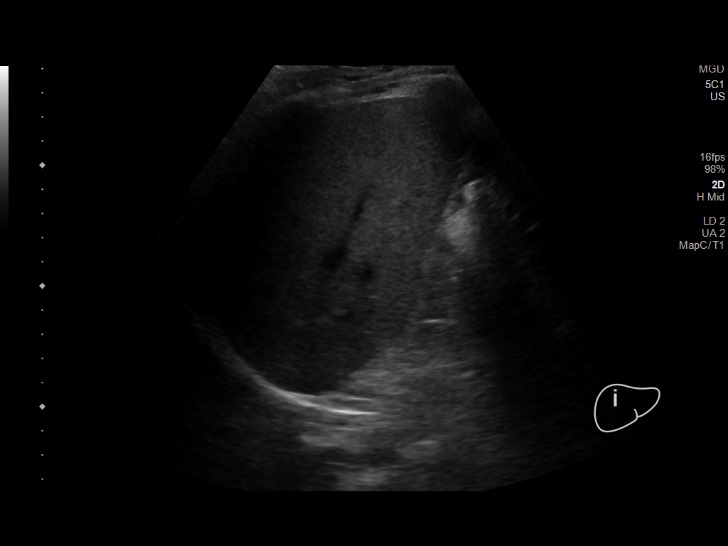
[im 76/165]
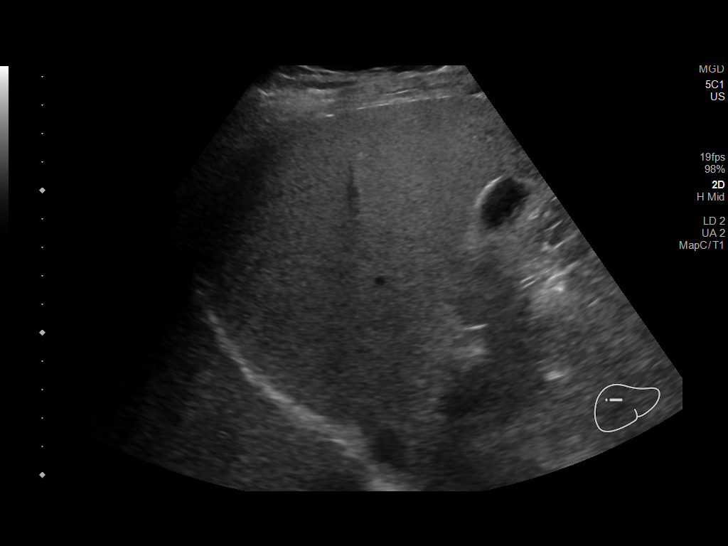
[im 89/165]
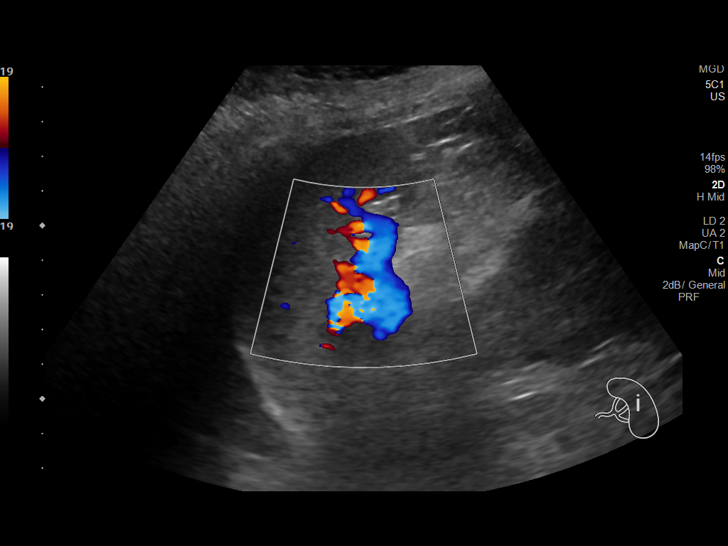
[im 103/165]
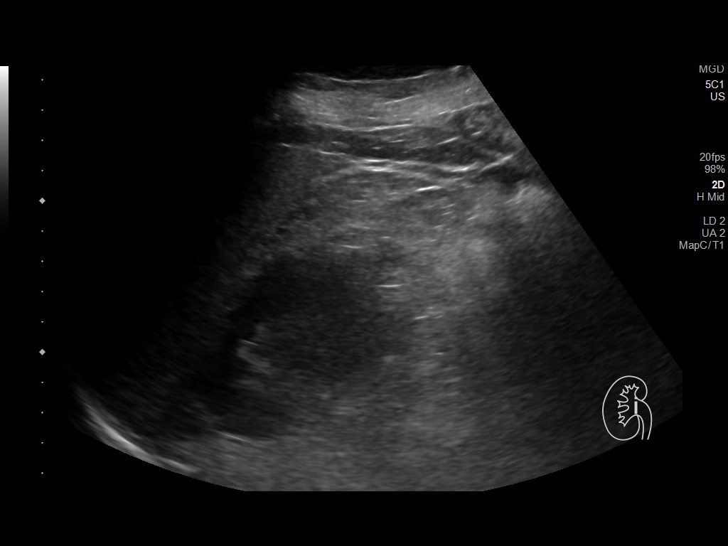
[im 110/165]
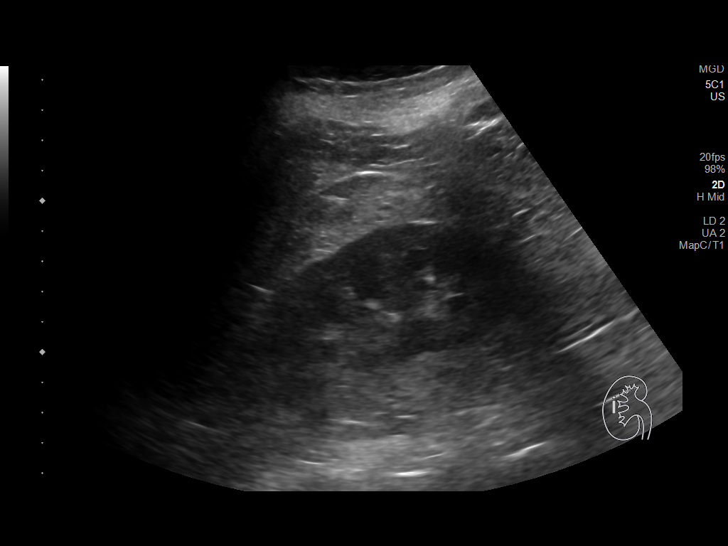
[im 124/165]
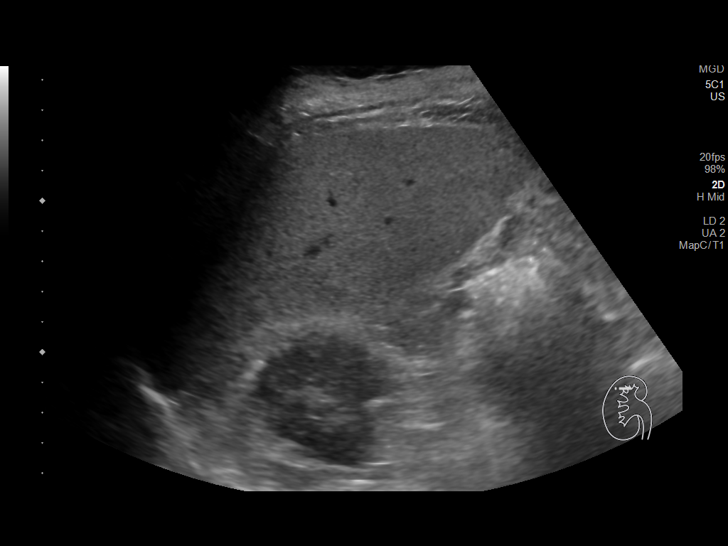
[im 137/165]
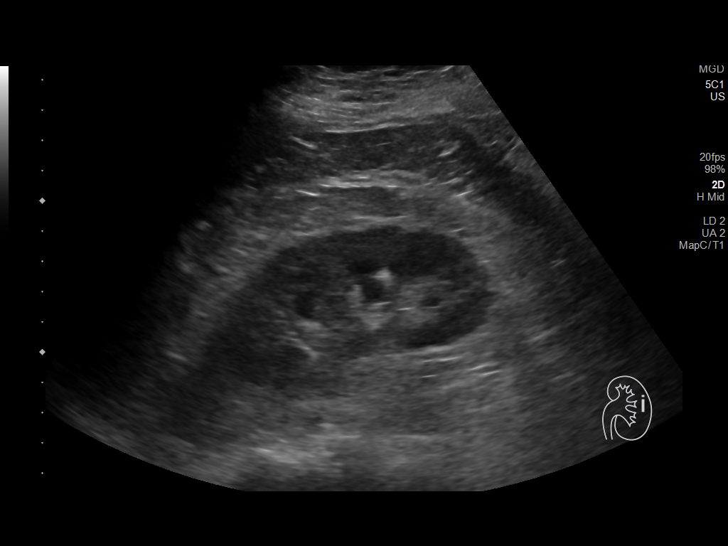
[im 151/165]
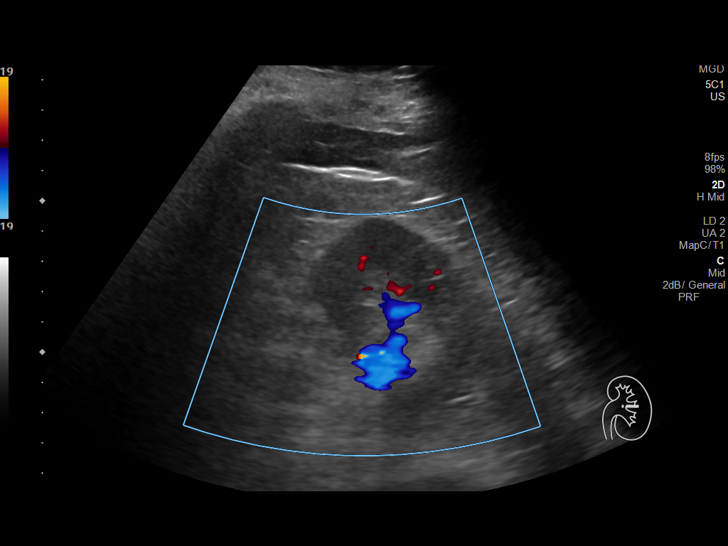
[im 165/165]
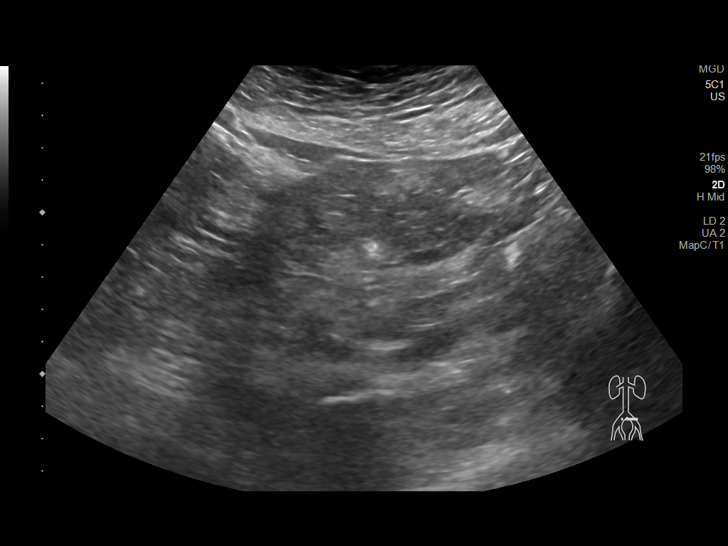

[14 of 25 positions shown; findings below may reference images not displayed]

FINDINGS: Gallbladder: No gallstones or wall thickening visualized. No
sonographic Murphy sign noted by sonographer.

Common bile duct: Diameter: Normal at 3 mm

Liver: No focal lesion identified. Within normal limits in
parenchymal echogenicity. Portal vein is patent on color Doppler
imaging with normal direction of blood flow towards the liver.

IVC: No abnormality visualized.

Pancreas: Visualized portion unremarkable.

Spleen: Size and appearance within normal limits.

Right Kidney: Length: 9.3 cm. Echogenicity within normal limits. No
mass or hydronephrosis visualized.

Left Kidney: Length: 9.6 cm. Echogenicity within normal limits. No
mass or hydronephrosis visualized.

Abdominal aorta: No aneurysm visualized.

Other findings: None.
IMPRESSION: Normal abdominal ultrasound.

## 2023-07-12 ENCOUNTER — Other Ambulatory Visit: Payer: Self-pay | Admitting: Internal Medicine

## 2023-08-16 ENCOUNTER — Encounter: Payer: Self-pay | Admitting: Internal Medicine

## 2023-08-17 ENCOUNTER — Encounter: Payer: Self-pay | Admitting: Internal Medicine

## 2023-08-17 ENCOUNTER — Telehealth: Payer: Self-pay | Admitting: Internal Medicine

## 2023-08-17 ENCOUNTER — Ambulatory Visit (INDEPENDENT_AMBULATORY_CARE_PROVIDER_SITE_OTHER): Payer: Commercial Managed Care - PPO | Admitting: Internal Medicine

## 2023-08-17 VITALS — BP 136/84 | HR 90 | Temp 98.1°F | Resp 16 | Ht 61.0 in | Wt 156.5 lb

## 2023-08-17 DIAGNOSIS — I10 Essential (primary) hypertension: Secondary | ICD-10-CM | POA: Diagnosis not present

## 2023-08-17 DIAGNOSIS — R7989 Other specified abnormal findings of blood chemistry: Secondary | ICD-10-CM | POA: Diagnosis not present

## 2023-08-17 DIAGNOSIS — F411 Generalized anxiety disorder: Secondary | ICD-10-CM

## 2023-08-17 DIAGNOSIS — Z1211 Encounter for screening for malignant neoplasm of colon: Secondary | ICD-10-CM

## 2023-08-17 DIAGNOSIS — Z0000 Encounter for general adult medical examination without abnormal findings: Secondary | ICD-10-CM

## 2023-08-17 DIAGNOSIS — Z0001 Encounter for general adult medical examination with abnormal findings: Secondary | ICD-10-CM

## 2023-08-17 DIAGNOSIS — J45909 Unspecified asthma, uncomplicated: Secondary | ICD-10-CM | POA: Diagnosis not present

## 2023-08-17 DIAGNOSIS — E785 Hyperlipidemia, unspecified: Secondary | ICD-10-CM

## 2023-08-17 DIAGNOSIS — Z23 Encounter for immunization: Secondary | ICD-10-CM | POA: Diagnosis not present

## 2023-08-17 MED ORDER — ALPRAZOLAM 0.5 MG PO TABS
0.5000 mg | ORAL_TABLET | Freq: Two times a day (BID) | ORAL | 1 refills | Status: AC | PRN
Start: 2023-08-17 — End: ?

## 2023-08-17 MED ORDER — ALBUTEROL SULFATE HFA 108 (90 BASE) MCG/ACT IN AERS: 2.0000 | INHALATION_SPRAY | Freq: Four times a day (QID) | RESPIRATORY_TRACT | 5 refills | Status: AC | PRN

## 2023-08-17 MED ORDER — AMLODIPINE BESYLATE 5 MG PO TABS: 5.0000 mg | ORAL_TABLET | Freq: Every day | ORAL | 1 refills | Status: DC

## 2023-08-17 MED ORDER — MONTELUKAST SODIUM 10 MG PO TABS: 10.0000 mg | ORAL_TABLET | Freq: Every day | ORAL | 1 refills | Status: DC

## 2023-08-17 MED ORDER — QVAR REDIHALER 80 MCG/ACT IN AERB: 2.0000 | INHALATION_SPRAY | Freq: Two times a day (BID) | RESPIRATORY_TRACT | 5 refills | Status: DC

## 2023-08-17 MED ORDER — LOSARTAN POTASSIUM 50 MG PO TABS: 50.0000 mg | ORAL_TABLET | Freq: Every day | ORAL | 1 refills | Status: DC

## 2023-08-17 NOTE — Patient Instructions (Addendum)
 Please call gastroenterology and set up colonoscopy at her convenience. [phone]   Check the  blood pressure regularly Blood pressure goal:  between 110/65 and  135/85. If it is consistently higher or lower, let me know     GO TO THE LAB : Get the blood work     Next visit with me in 4 months Please schedule it at the front desk

## 2023-08-17 NOTE — Telephone Encounter (Signed)
 Qvar  not covered by plan   Preferred alternatives:  Asmanex HFA  Pulmicort Flexhaler   Please advise

## 2023-08-17 NOTE — Progress Notes (Signed)
 Subjective:    Patient ID: Caitlin Williamson, female    DOB: 25-Nov-1975, 48 y.o.   MRN: [id number]  DOS:  08/17/2023 Type of visit - description: CPX  Since the last visit in 2023 she is doing okay. Chronic medical problems addressed , admits to some anxiety.    Review of Systems  Other than above, a 14 point review of systems is negative    Past Medical History:  Diagnosis Date   Allergic rhinitis    ANXIETY 03/11/2010   Qualifier: Diagnosis of  By: Amon MD, Aloysius BRAVO.    Asthma    Cervical cancer ([facility]) 2008   GERD (gastroesophageal reflux disease) 07/30/2013   Mild HTN     Past Surgical History:  Procedure Laterality Date   BACK SURGERY  1999   L4-L5   BREAST BIOPSY     right    MASS EXCISION  05-2009   shoulder  right   PARTIAL HYSTERECTOMY  2008   no oophorectomy   ROTATOR CUFF REPAIR     right   Social History   Socioeconomic History   Marital status: Married    Spouse name: Not on file   Number of children: 1   Years of education: Not on file   Highest education level: Not on file  Occupational History   Occupation: Ecologist aviation,travels a lot     Employer: [employer]  Tobacco Use   Smoking status: Former   Smokeless tobacco: Never  Advertising Account Planner   Vaping status: Never Used  Substance and Sexual Activity   Alcohol use: Yes    Comment: daily, moderate (2 to 5 drinks a day)   Drug use: Yes   Sexual activity: Not on file  Other Topics Concern   Not on file  Social History Narrative   Married , household pt and spouse    Son ~ 1995   2 g-baby (girls)   Social Drivers of Corporate Investment Banker Strain: Not on file  Food Insecurity: Not on file  Transportation Needs: Not on file  Physical Activity: Not on file  Stress: Not on file  Social Connections: Not on file  Intimate Partner Violence: Not on file    Current Outpatient Medications  Medication Instructions   albuterol  (VENTOLIN  HFA) 108 (90 Base) MCG/ACT inhaler 2 puffs, Inhalation,  Every 6 hours PRN   ALPRAZolam  (XANAX ) 0.5 mg, Oral, 2 times daily PRN   amLODipine  (NORVASC ) 5 mg, Oral, Daily   beclomethasone (QVAR  REDIHALER) 80 MCG/ACT inhaler 2 puffs, Inhalation, 2 times daily   fluticasone  (FLONASE ) 50 MCG/ACT nasal spray SPRAY 2 SPRAYS INTO EACH NOSTRIL EVERY DAY   ibuprofen (ADVIL) 400-600 mg, Every 6 hours PRN   losartan  (COZAAR ) 50 mg, Oral, Daily   montelukast  (SINGULAIR ) 10 mg, Oral, Daily at bedtime   omeprazole  (PRILOSEC) 40 mg, Oral, Daily   OTEZLA 30 MG TABS 1 tablet, 2 times daily       Objective:   Physical Exam BP 136/84   Pulse 90   Temp 98.1 F (36.7 C) (Oral)   Resp 16   Ht 5' 1 (1.549 m)   Wt 156 lb 8 oz (71 kg)   SpO2 99%   BMI 29.57 kg/m  General: Well developed, NAD, BMI noted Neck: No  thyromegaly  HEENT:  Normocephalic . Face symmetric, atraumatic Lungs:  CTA B Normal respiratory effort, no intercostal retractions, no accessory muscle use. Heart: RRR,  no murmur.  Abdomen:  Not distended,  soft, non-tender. No rebound or rigidity.   Lower extremities: no pretibial edema bilaterally  Skin: Exposed areas without rash. Not pale. Not jaundice Neurologic:  alert & oriented X3.  Speech normal, gait appropriate for age and unassisted Strength symmetric and appropriate for age.  Psych: Cognition and judgment appear intact.  Cooperative with normal attention span and concentration.  Behavior appropriate. No anxious or depressed appearing.     Assessment    ASSESSMENT HTN GERD Asthma Anxiety, insomnia Increased LFTs: + ANA 05-2021.  Other tests negative, saw GI.  EtOH?  Psoriasis - otezla    PLAN: Here for CPX - Td 2023 - pnm 23: 02-2015.   PNM 20 today.  (asthma) - s/p covid-flu shot ~04/2023 -Sees gyn, Dr Henry,  [facility] [phone], plans to proceed w/ a f/u [facility] -CCS: H/o iron deficiency anemia, Cscope 02-2012 neg, options discussed, elected GI referral. -Diet and exercise: Discussed - labs CMP FLP CBC TSH Other  issues addressed:  HTN:On amlodipine , losartan , BP today 136/84, check BPs sporadically at home, recommend to do regularly.  Goals provided. Asthma: Currently well-controlled, request refill of medications to have them just in case. Psoriasis: Was diagnosed with psoriasis, on Otezla per dermatology. Increased LFTs: Rechecking today.  See next EtOH: Drinks every day, 2 to 3 glasses of wine.  Reports it is  less than before.  Recommend moderation, 1 etoh serving every day is considered okay. Anxiety, insomnia:  she works long hours, often times out of town, admits to stress.  Request xanax , RF sent Dyslipidemia: last  LDL is 180, cardiovascular risk is 2.8.  No tobacco, no FH.  Check FLP, further advised for results in  the context of  increased LFTs. GERD: RF omeprazole  RTC 4  months

## 2023-08-18 ENCOUNTER — Encounter: Payer: Self-pay | Admitting: Internal Medicine

## 2023-08-18 LAB — CBC WITH DIFFERENTIAL/PLATELET
Absolute Lymphocytes: 2090 {cells}/uL (ref 850–3900)
Absolute Monocytes: 441 {cells}/uL (ref 200–950)
Basophils Absolute: 30 {cells}/uL (ref 0–200)
Basophils Relative: 0.4 %
Eosinophils Absolute: 61 {cells}/uL (ref 15–500)
Eosinophils Relative: 0.8 %
HCT: 38.6 % (ref 35.0–45.0)
Hemoglobin: 13.4 g/dL (ref 11.7–15.5)
MCH: 34.8 pg — ABNORMAL HIGH (ref 27.0–33.0)
MCHC: 34.7 g/dL (ref 32.0–36.0)
MCV: 100.3 fL — ABNORMAL HIGH (ref 80.0–100.0)
MPV: 9.7 fL (ref 7.5–12.5)
Monocytes Relative: 5.8 %
Neutro Abs: 4978 {cells}/uL (ref 1500–7800)
Neutrophils Relative %: 65.5 %
Platelets: 317 10*3/uL (ref 140–400)
RBC: 3.85 10*6/uL (ref 3.80–5.10)
RDW: 13.3 % (ref 11.0–15.0)
Total Lymphocyte: 27.5 %
WBC: 7.6 10*3/uL (ref 3.8–10.8)

## 2023-08-18 LAB — COMPREHENSIVE METABOLIC PANEL
AG Ratio: 1.6 (calc) (ref 1.0–2.5)
ALT: 31 U/L — ABNORMAL HIGH (ref 6–29)
AST: 28 U/L (ref 10–35)
Albumin: 4.7 g/dL (ref 3.6–5.1)
Alkaline phosphatase (APISO): 104 U/L (ref 31–125)
BUN: 9 mg/dL (ref 7–25)
CO2: 25 mmol/L (ref 20–32)
Calcium: 10.3 mg/dL — ABNORMAL HIGH (ref 8.6–10.2)
Chloride: 100 mmol/L (ref 98–110)
Creat: 0.91 mg/dL (ref 0.50–0.99)
Globulin: 2.9 g/dL (ref 1.9–3.7)
Glucose, Bld: 101 mg/dL — ABNORMAL HIGH (ref 65–99)
Potassium: 3.8 mmol/L (ref 3.5–5.3)
Sodium: 138 mmol/L (ref 135–146)
Total Bilirubin: 0.9 mg/dL (ref 0.2–1.2)
Total Protein: 7.6 g/dL (ref 6.1–8.1)

## 2023-08-18 LAB — TSH: TSH: 2.31 m[IU]/L

## 2023-08-18 LAB — LIPID PANEL
Cholesterol: 330 mg/dL — ABNORMAL HIGH (ref <200)
HDL: 64 mg/dL (ref >=50)
LDL Cholesterol (Calc): 232 mg/dL — ABNORMAL HIGH
Non-HDL Cholesterol (Calc): 266 mg/dL — ABNORMAL HIGH (ref <130)
Total CHOL/HDL Ratio: 5.2 (calc) — ABNORMAL HIGH (ref <5.0)
Triglycerides: 169 mg/dL — ABNORMAL HIGH (ref <150)

## 2023-08-18 NOTE — Assessment & Plan Note (Signed)
 Here for CPX Other issues addressed:  HTN:On amlodipine , losartan , BP today 136/84, check BPs sporadically at home, recommend to do regularly.  Goals provided. Asthma: Currently well-controlled, request refill of medications to have them just in case. Psoriasis: Was diagnosed with psoriasis, on Otezla per dermatology. Increased LFTs: Rechecking today.  See next EtOH: Drinks every day, 2 to 3 glasses of wine.  Reports it is  less than before.  Recommend moderation, 1 etoh serving every day is considered okay. Anxiety, insomnia:  she works long hours, often times out of town, admits to stress.  Request xanax , RF sent Dyslipidemia: last  LDL is 180, cardiovascular risk is 2.8.  No tobacco, no FH.  Check FLP, further advised for results in  the context of  increased LFTs. GERD: RF omeprazole  RTC 4  months

## 2023-08-18 NOTE — Assessment & Plan Note (Signed)
 Here for CPX - Td 2023 - pnm 23: 02-2015.   PNM 20 today.  (asthma) - s/p covid-flu shot ~04/2023 -Sees gyn, Dr Henry,  [facility] [phone], plans to proceed w/ a f/u [facility] -CCS: H/o iron deficiency anemia, Cscope 02-2012 neg, options discussed, elected GI referral. -Diet and exercise: Discussed - labs CMP FLP CBC TSH

## 2023-08-18 NOTE — Telephone Encounter (Signed)
 Ok to change from beclomethasone to Asmanex 200 mcg BID

## 2023-08-20 ENCOUNTER — Encounter: Payer: Self-pay | Admitting: Internal Medicine

## 2023-08-20 MED ORDER — ATORVASTATIN CALCIUM 40 MG PO TABS: 40.0000 mg | ORAL_TABLET | Freq: Every day | ORAL | 0 refills | Status: DC

## 2023-08-20 NOTE — Addendum Note (Signed)
 Addended by: Izmael Duross D on: 08/20/2023 09:53 AM   Modules accepted: Orders

## 2023-08-21 ENCOUNTER — Encounter: Payer: Self-pay | Admitting: Internal Medicine

## 2023-10-08 ENCOUNTER — Other Ambulatory Visit

## 2023-10-30 ENCOUNTER — Encounter: Payer: Self-pay | Admitting: Gastroenterology

## 2023-10-30 ENCOUNTER — Ambulatory Visit (INDEPENDENT_AMBULATORY_CARE_PROVIDER_SITE_OTHER): Admitting: Gastroenterology

## 2023-10-30 VITALS — BP 100/60 | HR 80 | Ht 59.75 in | Wt 154.0 lb

## 2023-10-30 DIAGNOSIS — F1090 Alcohol use, unspecified, uncomplicated: Secondary | ICD-10-CM

## 2023-10-30 DIAGNOSIS — R760 Raised antibody titer: Secondary | ICD-10-CM

## 2023-10-30 DIAGNOSIS — R7989 Other specified abnormal findings of blood chemistry: Secondary | ICD-10-CM | POA: Insufficient documentation

## 2023-10-30 DIAGNOSIS — Z1211 Encounter for screening for malignant neoplasm of colon: Secondary | ICD-10-CM

## 2023-10-30 MED ORDER — NA SULFATE-K SULFATE-MG SULF 17.5-3.13-1.6 GM/177ML PO SOLN: 1.0000 | Freq: Once | ORAL | 0 refills | Status: AC

## 2023-10-30 NOTE — Progress Notes (Signed)
 10/30/2023 Caitlin Williamson [id number] 12/14/1975   HISTORY OF PRESENT ILLNESS: This is a pleasant 48 year old female who is a previously a patient of Dr. Howard Macho.  Her care will be assumed by Dr. Brice Campi.  She is here today to discuss and schedule colonoscopy and for follow-up of elevated LFTs.  Last colonoscopy was in 2013 and was normal.  She does not have any GI complaints.  Moves her bowels regularly.  No rectal bleeding.  In regards to the elevated LFTs,  these were evaluated by Dr. Howard Macho previously.  Ultrasound in November 2022 looked normal.  All other serologic workup was negative except for an elevated ANA.  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  Drinks 3-4 servings of alcohol in the form of seltzers, wine, or beer almost every day.  Actually last LFTs in February were about normal with an ALT of 31, but others had normalized.  Was put on a statin in February as well as was due for repeat LFTs again in the near future.    Past Medical History:  Diagnosis Date   Allergic rhinitis    ANXIETY 03/11/2010   Qualifier: Diagnosis of  By: Neomi Banks MD, Anitra Ket.    Asthma    Cervical cancer ([facility]) 2008   GERD (gastroesophageal reflux disease) 07/30/2013   Mild HTN    Past Surgical History:  Procedure Laterality Date   BACK SURGERY  1999   L4-L5   BREAST BIOPSY     right    MASS EXCISION  05-2009   shoulder  right   PARTIAL HYSTERECTOMY  2008   no oophorectomy   ROTATOR CUFF REPAIR     right    reports that she has quit smoking. She has never used smokeless tobacco. She reports current alcohol use. She reports current drug use. family history includes Breast cancer in her maternal grandmother; Diabetes in her mother; Heart disease in her maternal grandmother; Heart failure in her mother; Hypertension in her mother; Stroke in her maternal grandmother. Allergies  Allergen Reactions   Penicillins Anaphylaxis   Sulfonamide Derivatives Anaphylaxis       Outpatient Encounter Medications as of 10/30/2023  Medication Sig   albuterol  (VENTOLIN  HFA) 108 (90 Base) MCG/ACT inhaler Inhale 2 puffs into the lungs every 6 (six) hours as needed for wheezing or shortness of breath.   ALPRAZolam  (XANAX ) 0.5 MG tablet Take 1 tablet (0.5 mg total) by mouth 2 (two) times daily as needed for anxiety or sleep.   amLODipine  (NORVASC ) 5 MG tablet Take 1 tablet (5 mg total) by mouth daily.   atorvastatin  (LIPITOR) 40 MG tablet Take 1 tablet (40 mg total) by mouth at bedtime.   clobetasol (TEMOVATE) 0.05 % external solution Apply 1 Application topically 2 (two) times daily.   Cyanocobalamin (VITAMIN B12 PO) Take 1 tablet by mouth daily.   fluticasone  (FLONASE ) 50 MCG/ACT nasal spray SPRAY 2 SPRAYS INTO EACH NOSTRIL EVERY DAY (Patient taking differently: Place 2 sprays into both nostrils as needed.)   ibuprofen (ADVIL,MOTRIN) 200 MG tablet Take 400-600 mg by mouth every 6 (six) hours as needed. For pain   losartan  (COZAAR ) 50 MG tablet Take 1 tablet (50 mg total) by mouth daily.   Mometasone Furoate (ASMANEX HFA) 200 MCG/ACT AERO Inhale 1 puff into the lungs in the morning and at bedtime.   montelukast  (SINGULAIR ) 10 MG tablet Take 1 tablet (10 mg total) by mouth at bedtime.   omeprazole  (  PRILOSEC) 40 MG capsule Take 1 capsule (40 mg total) by mouth daily.   OTEZLA 30 MG TABS Take 1 tablet by mouth 2 (two) times daily.   VITAMIN D  PO Take 1 tablet by mouth daily.   VITAMIN E PO Take 1 capsule by mouth daily.   VTAMA 1 % CREA Apply 1 Application topically as needed.   ZORYVE 0.3 % FOAM Apply 1 Application topically as needed.   No facility-administered encounter medications on file as of 10/30/2023.    REVIEW OF SYSTEMS  : All other systems reviewed and negative except where noted in the History of Present Illness.   PHYSICAL EXAM: BP 100/60 (BP Location: Left Arm, Patient Position: Sitting, Cuff Size: Normal)   Pulse 80   Ht 4' 11.75" (1.518 m) Comment: height  measured without shoes  Wt 154 lb (69.9 kg)   BMI 30.33 kg/m  General: Well developed white female in no acute distress Head: Normocephalic and atraumatic Eyes:  Sclerae anicteric, conjunctiva pink. Ears: Normal auditory acuity Lungs: Clear throughout to auscultation; no W/R/R. Heart: Regular rate and rhythm; no M/R/G. Rectal:  Will be done at the time of colonoscopy Musculoskeletal: Symmetrical with no gross deformities  Skin: No lesions on visible extremities Neurological: Alert oriented x 4, grossly non-focal Psychological:  Alert and cooperative. Normal mood and affect  ASSESSMENT AND PLAN: *CRC screening:  Normal colonoscopy 2013.  Will schedule with Dr. Brice Campi.  The risks, benefits, and alternatives to colonoscopy were discussed with the patient and she consents to proceed.  *Elevated LFTs: These were evaluated by Dr. Howard Macho previously.  Ultrasound in November 2022 looked normal.  All other serologic workup was negative except for an elevated ANA.  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  Drinks 3-4 servings of alcohol in the form of seltzers, wine, or beer almost every day.  Actually last LFTs in February were about normal with an ALT of 31, but others had normalized.  Was put on a statin in February as well as was due for repeat LFTs again in the near future.  If they continue to rise significantly I think we have an explanation, likely due to alcohol, but if they rise again consider repeat ultrasound and possibly liver biopsy.     CC:  Ezell Hollow, MD

## 2023-10-30 NOTE — Patient Instructions (Signed)
 You have been scheduled for a colonoscopy. Please follow written instructions given to you at your visit today.   If you use inhalers (even only as needed), please bring them with you on the day of your procedure.  DO NOT TAKE 7 DAYS PRIOR TO TEST- Trulicity (dulaglutide) Ozempic, Wegovy (semaglutide) Mounjaro (tirzepatide) Bydureon Bcise (exanatide extended release)  DO NOT TAKE 1 DAY PRIOR TO YOUR TEST Rybelsus (semaglutide) Adlyxin (lixisenatide) Victoza (liraglutide) Byetta (exanatide)  _______________________________________________________  If your blood pressure at your visit was 140/90 or greater, please contact your primary care physician to follow up on this.  _______________________________________________________  If you are age 75 or older, your body mass index should be between 23-30. Your Body mass index is 30.33 kg/m. If this is out of the aforementioned range listed, please consider follow up with your Primary Care Provider.  If you are age 62 or younger, your body mass index should be between 19-25. Your Body mass index is 30.33 kg/m. If this is out of the aformentioned range listed, please consider follow up with your Primary Care Provider.   ________________________________________________________  The [facility] providers would like to encourage you to use MYCHART to communicate with providers for non-urgent requests or questions.  Due to long hold times on the telephone, sending your provider a message by [facility] may be a faster and more efficient way to get a response.  Please allow 48 business hours for a response.  Please remember that this is for non-urgent requests.  _______________________________________________________

## 2023-10-30 NOTE — Progress Notes (Signed)
 Attending Physician's Attestation   I have reviewed the chart.   I agree with the Advanced Practitioner's note, impression, and recommendations with any updates as below.    Corliss Parish, MD [facility] Advanced Endoscopy Office # [phone]

## 2023-11-16 ENCOUNTER — Other Ambulatory Visit: Payer: Self-pay | Admitting: Internal Medicine

## 2023-11-22 ENCOUNTER — Encounter: Payer: Self-pay | Admitting: Internal Medicine

## 2023-11-23 ENCOUNTER — Ambulatory Visit: Payer: Self-pay | Admitting: Internal Medicine

## 2023-11-23 ENCOUNTER — Other Ambulatory Visit (INDEPENDENT_AMBULATORY_CARE_PROVIDER_SITE_OTHER)

## 2023-11-23 DIAGNOSIS — E785 Hyperlipidemia, unspecified: Secondary | ICD-10-CM

## 2023-11-23 LAB — LIPID PANEL
Cholesterol: 183 mg/dL (ref 0–200)
HDL: 41.6 mg/dL (ref >39.00)
LDL Cholesterol: 106 mg/dL — ABNORMAL HIGH (ref 0–99)
NonHDL: 141.75
Total CHOL/HDL Ratio: 4
Triglycerides: 179 mg/dL — ABNORMAL HIGH (ref 0.0–149.0)
VLDL: 35.8 mg/dL (ref 0.0–40.0)

## 2023-11-23 LAB — ALT: ALT: 40 U/L — ABNORMAL HIGH (ref 0–35)

## 2023-11-23 LAB — AST: AST: 38 U/L — ABNORMAL HIGH (ref 0–37)

## 2023-11-23 MED ORDER — ATORVASTATIN CALCIUM 40 MG PO TABS: 40.0000 mg | ORAL_TABLET | Freq: Every day | ORAL | 2 refills | Status: AC

## 2023-12-14 ENCOUNTER — Telehealth: Payer: Self-pay | Admitting: Gastroenterology

## 2023-12-14 NOTE — Telephone Encounter (Signed)
 Good morning Dr. Brice Campi, I received a call from this patient requesting to reschedule her colonoscopy due to her not being able to come back from being out of town sooner. Patient was rescheduled for August the 6 th. Patient was originally scheduled for June the 10 th. Please advise.

## 2023-12-14 NOTE — Telephone Encounter (Signed)
 1 time no late cancellation fee. If she reschedules again will need to be assessed.

## 2023-12-18 ENCOUNTER — Encounter: Admitting: Gastroenterology

## 2024-01-19 ENCOUNTER — Other Ambulatory Visit: Payer: Self-pay | Admitting: Internal Medicine

## 2024-02-06 ENCOUNTER — Telehealth: Payer: Self-pay | Admitting: Gastroenterology

## 2024-02-06 NOTE — Telephone Encounter (Signed)
 Good Afternooon Dr. [place], This patient called and stated that she will have to cancel her procedure due to her having to travel out of town. Patient will call back to get reschedule. Please advise.

## 2024-02-06 NOTE — Telephone Encounter (Signed)
 Placed recall into the system for 6 months if she has not called back before then. Thanks. GM

## 2024-02-13 ENCOUNTER — Encounter: Admitting: Gastroenterology

## 2024-02-17 ENCOUNTER — Other Ambulatory Visit: Payer: Self-pay | Admitting: Internal Medicine

## 2024-04-19 ENCOUNTER — Other Ambulatory Visit: Payer: Self-pay | Admitting: Internal Medicine

## 2024-04-20 ENCOUNTER — Other Ambulatory Visit: Payer: Self-pay | Admitting: Internal Medicine

## 2024-04-21 ENCOUNTER — Other Ambulatory Visit: Payer: Self-pay | Admitting: Internal Medicine

## 2024-05-19 ENCOUNTER — Other Ambulatory Visit: Payer: Self-pay | Admitting: Internal Medicine

## 2024-05-23 ENCOUNTER — Other Ambulatory Visit: Payer: Self-pay | Admitting: Internal Medicine

## 2024-07-25 ENCOUNTER — Ambulatory Visit: Admitting: Internal Medicine

## 2024-08-18 ENCOUNTER — Encounter: Admitting: Internal Medicine

## 2024-10-07 ENCOUNTER — Encounter: Admitting: Internal Medicine
